# Patient Record
Sex: Female | Born: 1990 | Race: White | Hispanic: No | Marital: Single
Health system: Midwestern US, Community
[De-identification: ages and names within clinical notes are randomized; demographics above are authoritative.]

## PROBLEM LIST (undated history)

## (undated) DIAGNOSIS — E1043 Type 1 diabetes mellitus with diabetic autonomic (poly)neuropathy: Principal | ICD-10-CM

## (undated) DIAGNOSIS — E0500 Thyrotoxicosis with diffuse goiter without thyrotoxic crisis or storm: Secondary | ICD-10-CM

## (undated) DIAGNOSIS — E1065 Type 1 diabetes mellitus with hyperglycemia: Secondary | ICD-10-CM

## (undated) DIAGNOSIS — R7989 Other specified abnormal findings of blood chemistry: Secondary | ICD-10-CM

## (undated) DIAGNOSIS — E1021 Type 1 diabetes mellitus with diabetic nephropathy: Principal | ICD-10-CM

## (undated) DIAGNOSIS — Z1329 Encounter for screening for other suspected endocrine disorder: Secondary | ICD-10-CM

## (undated) DIAGNOSIS — E0590 Thyrotoxicosis, unspecified without thyrotoxic crisis or storm: Secondary | ICD-10-CM

## (undated) DIAGNOSIS — E109 Type 1 diabetes mellitus without complications: Secondary | ICD-10-CM

---

## 2011-09-14 ENCOUNTER — Inpatient Hospital Stay: Payer: Self-pay | Admitting: *Deleted

## 2011-09-14 LAB — BASIC METABOLIC PANEL WITH GFR
Anion Gap: 12
BUN: 4 mg/dL — ABNORMAL LOW
Calcium, Total: 8.4 mg/dL — ABNORMAL LOW
Chloride: 108 mmol/L — ABNORMAL HIGH
Co2: 22 mmol/L
Creatinine: 0.53 mg/dL — ABNORMAL LOW
EGFR (African American): >60
EGFR (Non-African Amer.): >60
Glucose: 158 mg/dL — ABNORMAL HIGH
Osmolality: 283
Potassium: 3.9 mmol/L
Sodium: 142 mmol/L

## 2011-09-14 LAB — COMPREHENSIVE METABOLIC PANEL
Albumin: 4 g/dL (ref 3.8–5.6)
Alkaline Phosphatase: 108 U/L (ref 82–169)
BUN: 8 mg/dL (ref 7–18)
Bilirubin,Total: 0.4 mg/dL (ref 0.2–1.0)
Calcium, Total: 8 mg/dL — ABNORMAL LOW (ref 9.0–10.7)
Chloride: 97 mmol/L — ABNORMAL LOW (ref 98–107)
Co2: 21 mmol/L (ref 21–32)
EGFR (African American): >60
EGFR (Non-African Amer.): >60
Glucose: 514 mg/dL (ref 65–99)
Osmolality: 297 (ref 275–301)
SGPT (ALT): 25 U/L
Sodium: 138 mmol/L (ref 136–145)
Total Protein: 7.2 g/dL (ref 6.4–8.6)

## 2011-09-14 LAB — BASIC METABOLIC PANEL
Calcium, Total: 7.4 mg/dL — ABNORMAL LOW (ref 9.0–10.7)
Creatinine: 0.5 mg/dL — ABNORMAL LOW (ref 0.60–1.30)
EGFR (African American): >60
EGFR (Non-African Amer.): >60
Osmolality: 289 (ref 275–301)
Potassium: 3.9 mmol/L (ref 3.5–5.1)
Sodium: 138 mmol/L (ref 136–145)

## 2011-09-14 LAB — URINALYSIS, COMPLETE
Glucose,UR: >=500 mg/dL (ref 0–75)
Leukocyte Esterase: NEGATIVE
Nitrite: NEGATIVE
Ph: 5 (ref 4.5–8.0)
Protein: NEGATIVE
RBC,UR: <1 /HPF (ref 0–5)
Specific Gravity: 1.028 (ref 1.003–1.030)
WBC UR: NONE SEEN /HPF (ref 0–5)

## 2011-09-14 LAB — MAGNESIUM: Magnesium: 2 mg/dL

## 2011-09-14 LAB — CBC
HCT: 43.1 % (ref 35.0–47.0)
MCH: 32.5 pg (ref 26.0–34.0)
MCV: 93 fL (ref 80–100)
Platelet: 241 10*3/uL (ref 150–440)
RBC: 4.64 10*6/uL (ref 3.80–5.20)

## 2011-09-14 LAB — DRUG SCREEN, URINE
Amphetamines, Ur Screen: NEGATIVE (ref ?–1000)
Benzodiazepine, Ur Scrn: NEGATIVE (ref ?–200)
Methadone, Ur Screen: NEGATIVE (ref ?–300)
Phencyclidine (PCP) Ur S: NEGATIVE (ref ?–25)
Tricyclic, Ur Screen: NEGATIVE (ref ?–1000)

## 2011-09-14 LAB — PROTIME-INR
INR: 1.1
Prothrombin Time: 14.5 secs (ref 11.5–14.7)

## 2011-09-14 LAB — HEMOGLOBIN A1C: Hemoglobin A1C: 13 % — ABNORMAL HIGH (ref 4.2–6.3)

## 2011-09-14 LAB — ETHANOL: Ethanol %: 0.296 % — ABNORMAL HIGH (ref 0.000–0.080)

## 2011-09-14 LAB — TROPONIN I: Troponin-I: <0.02 ng/mL

## 2011-09-15 LAB — LIPID PANEL
Cholesterol: 150 mg/dL (ref 0–200)
HDL Cholesterol: 34 mg/dL — ABNORMAL LOW (ref 40–60)
Ldl Cholesterol, Calc: 91 mg/dL (ref 0–100)
VLDL Cholesterol, Calc: 25 mg/dL (ref 5–40)

## 2011-09-15 LAB — CBC WITH DIFFERENTIAL/PLATELET
Basophil #: 0.1 10*3/uL (ref 0.0–0.1)
Eosinophil #: 0.1 10*3/uL (ref 0.0–0.7)
HCT: 39.8 % (ref 35.0–47.0)
HGB: 13.6 g/dL (ref 12.0–16.0)
Lymphocyte %: 47.2 %
MCH: 32.4 pg (ref 26.0–34.0)
MCV: 95 fL (ref 80–100)
Monocyte #: 0.7 10*3/uL (ref 0.0–0.7)
Neutrophil %: 42.4 %
RBC: 4.2 10*6/uL (ref 3.80–5.20)
RDW: 12.7 % (ref 11.5–14.5)

## 2011-09-15 LAB — BASIC METABOLIC PANEL
Co2: 25 mmol/L (ref 21–32)
Creatinine: 0.5 mg/dL — ABNORMAL LOW (ref 0.60–1.30)
EGFR (African American): >60
EGFR (Non-African Amer.): >60
Glucose: 251 mg/dL — ABNORMAL HIGH (ref 65–99)
Potassium: 3.5 mmol/L (ref 3.5–5.1)
Sodium: 142 mmol/L (ref 136–145)

## 2012-11-04 ENCOUNTER — Emergency Department: Payer: Self-pay | Admitting: Emergency Medicine

## 2013-03-04 IMAGING — CT CT HEAD WITHOUT CONTRAST
1 series · 16 of 30 positions shown, 20 images · non-contrast
Comparison: none

REASON FOR EXAM: unresponsive patient
COMMENTS:   May transport without cardiac monitor

PROCEDURE:     CT  - CT HEAD WITHOUT CONTRAST  - September 14, 2011  [DATE]
RESULT:     Comparison:  None
TECHNIQUE: Multiple axial images from the foramen magnum to the vertex were
obtained without IV contrast.

[Series 3: bone · axial · 0.42mm/px · z∈[-132,+13]mm · 16 of 33 slices shown, 20 images]
[im 2/33  brain]
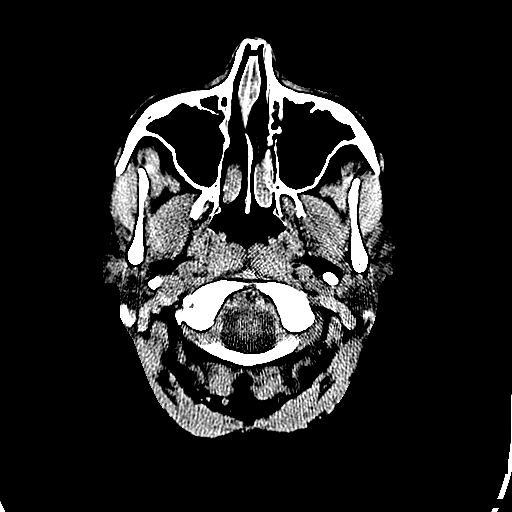
[im 2/33  bone]
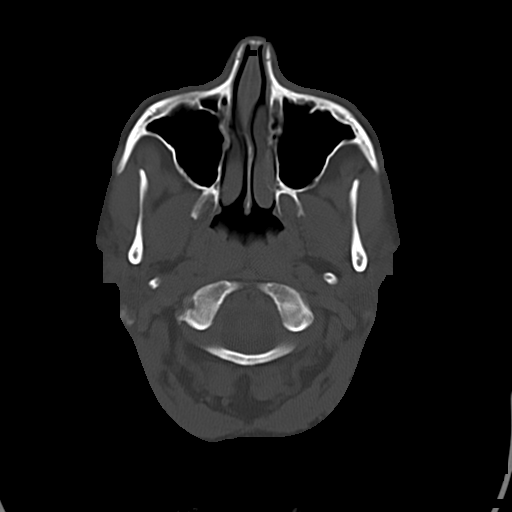
[im 4/33  brain]
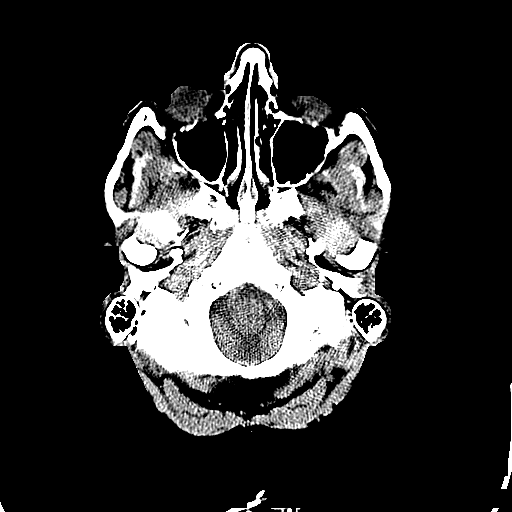
[im 6/33  brain]
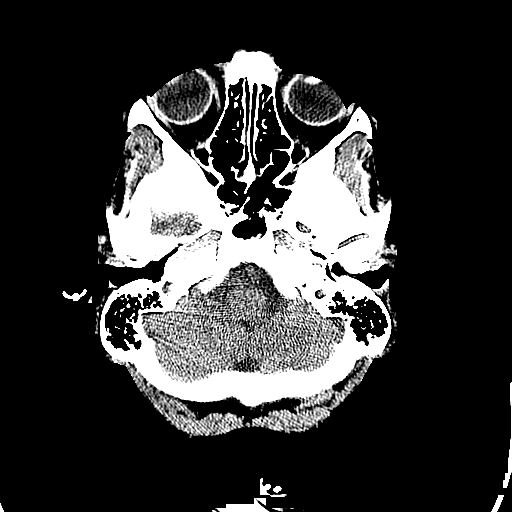
[im 8/33  brain]
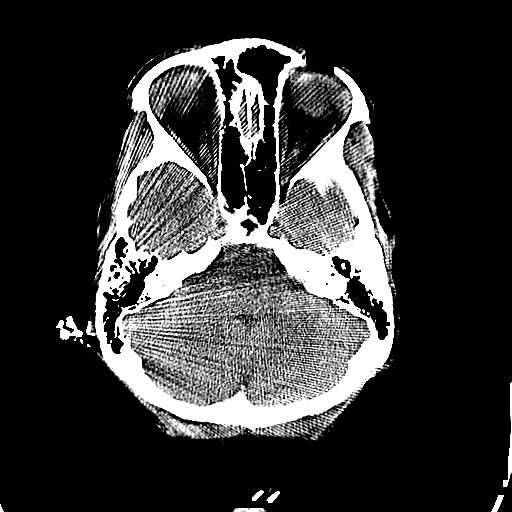
[im 9/33  brain]
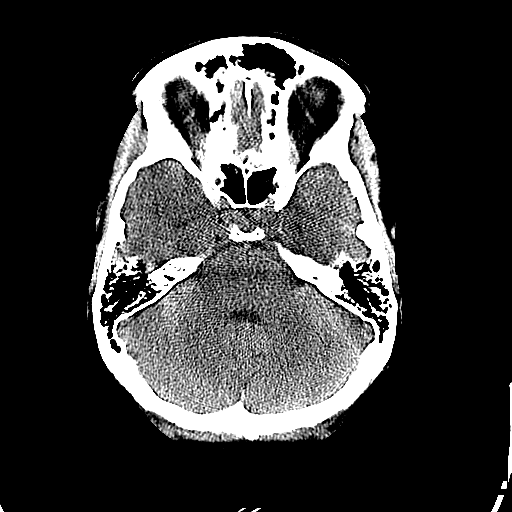
[im 9/33  bone]
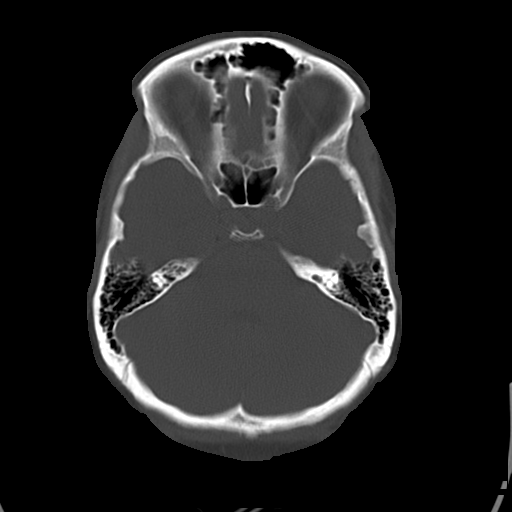
[im 12/33  brain]
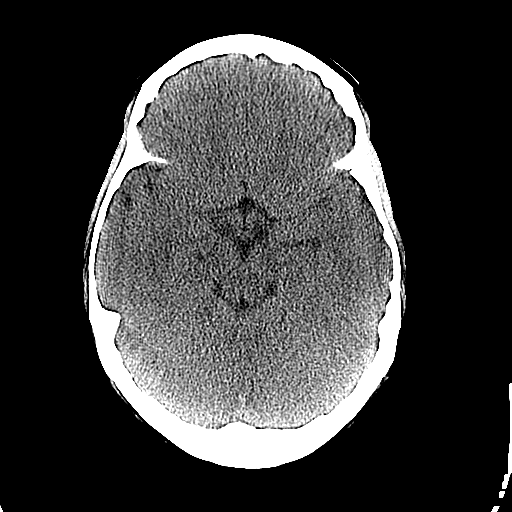
[im 14/33  brain]
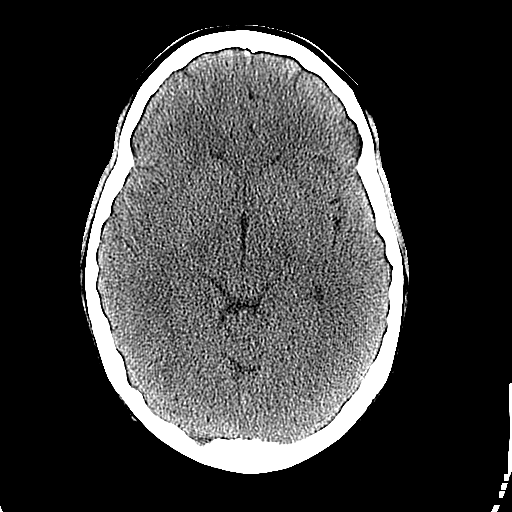
[im 16/33  brain]
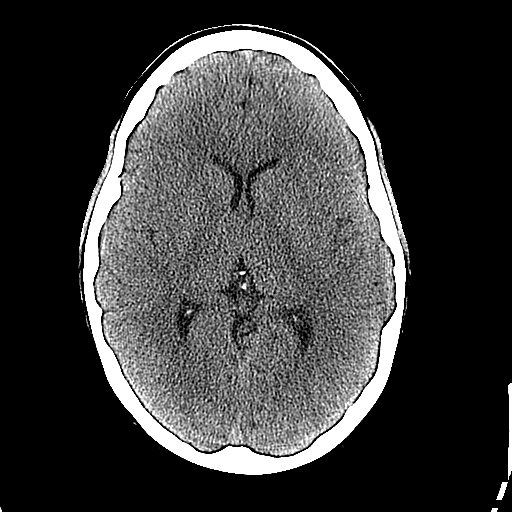
[im 17/33  brain]
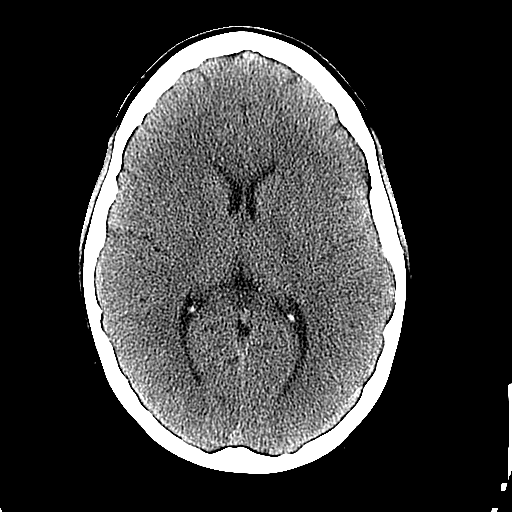
[im 17/33  bone]
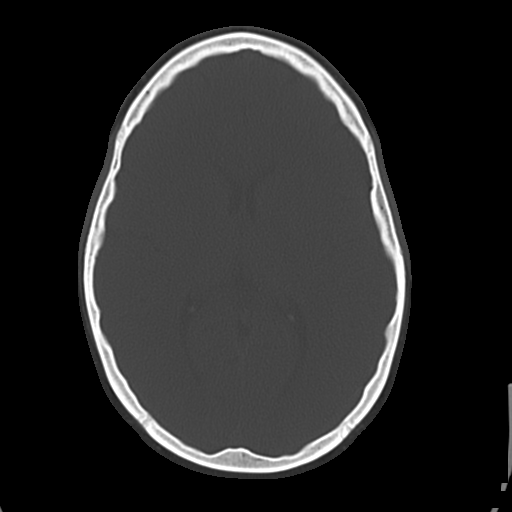
[im 19/33  brain]
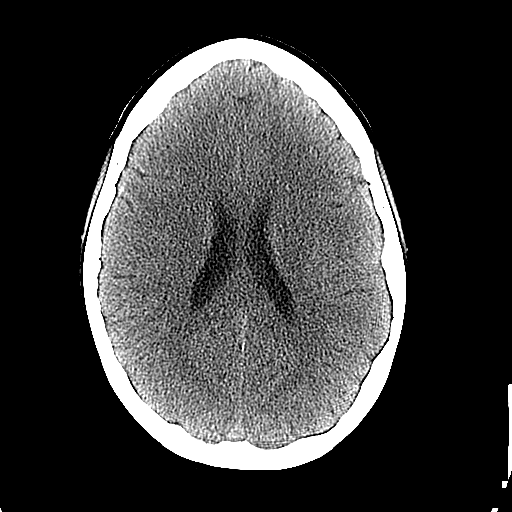
[im 21/33  brain]
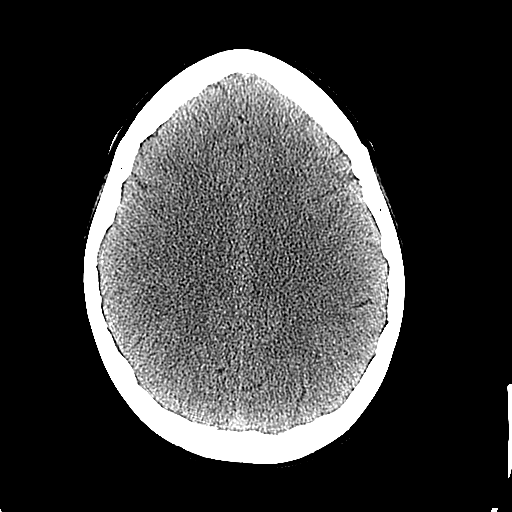
[im 24/33  brain]
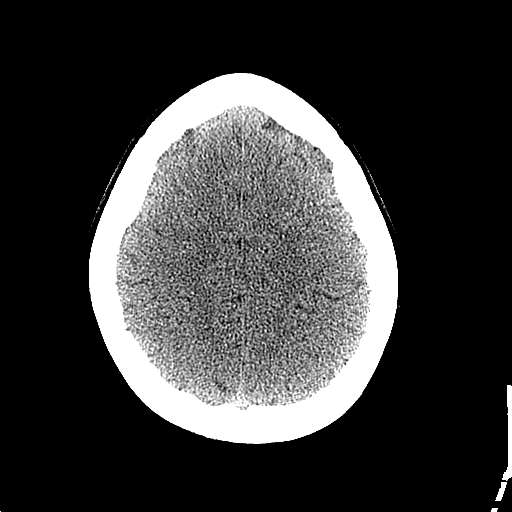
[im 25/33  brain]
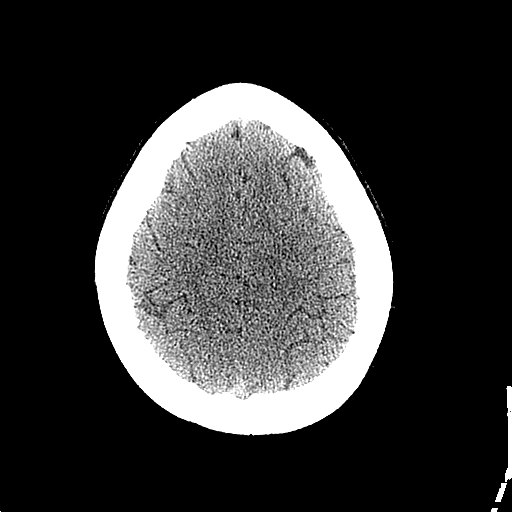
[im 25/33  bone]
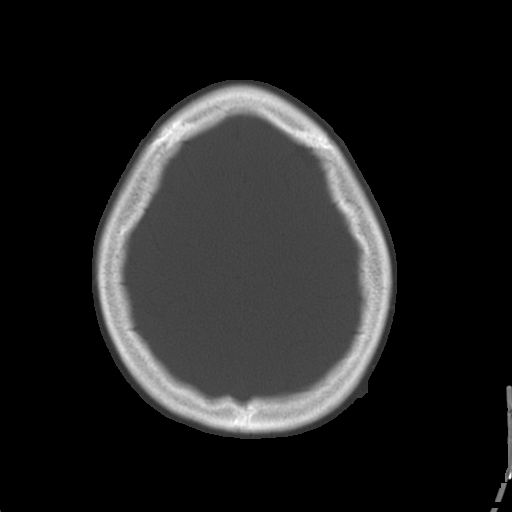
[im 27/33  brain]
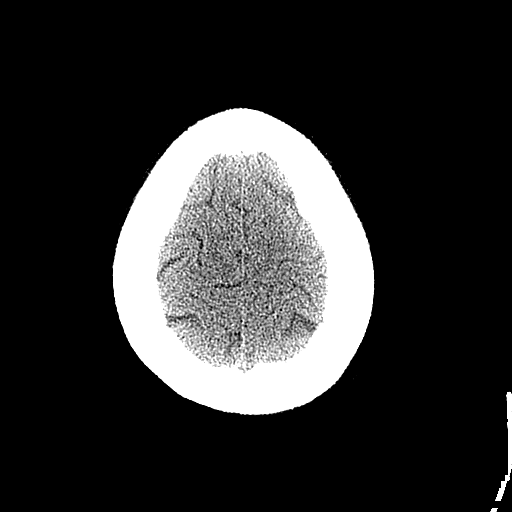
[im 29/33  brain]
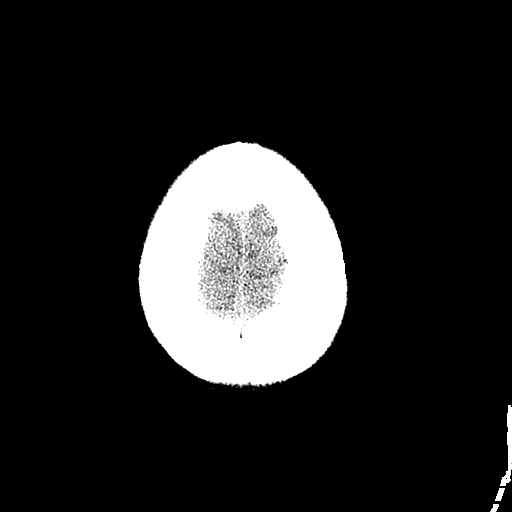
[im 31/33  brain]
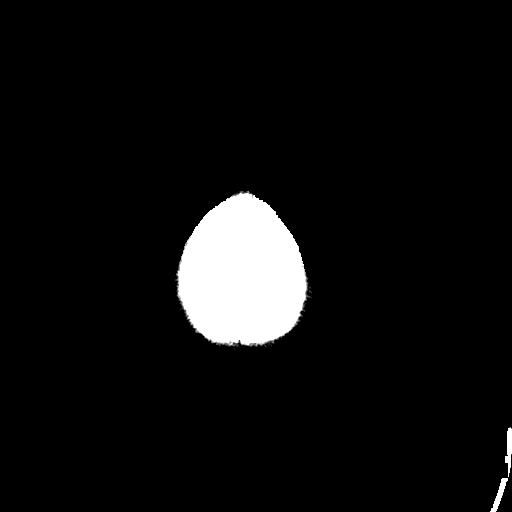

[16 of 30 positions shown; findings below may reference images not displayed]

FINDINGS: There is no evidence of mass effect, midline shift, or extra-axial fluid
collections.  There is no evidence of a space-occupying lesion or
intracranial hemorrhage. There is no evidence of a cortical-based area of
acute infarction.

The ventricles and sulci are appropriate for the patient's age. The basal
cisterns are patent.

Visualized portions of the orbits are unremarkable. The visualized portions
of the paranasal sinuses and mastoid air cells are unremarkable.

The osseous structures are unremarkable.
IMPRESSION: No acute intracranial process.

## 2013-03-04 IMAGING — CR DG CHEST 1V PORT
1 series · 1 of 1 positions shown · non-contrast
Comparison: none

REASON FOR EXAM: altered mental status
COMMENTS:

PROCEDURE:     DXR - DXR PORTABLE CHEST SINGLE VIEW  - September 14, 2011  [DATE]
RESULT:
The patient has taken a poor inspiratory effort. The lungs are clear. The
cardiovascular structures are unremarkable.

[portable]
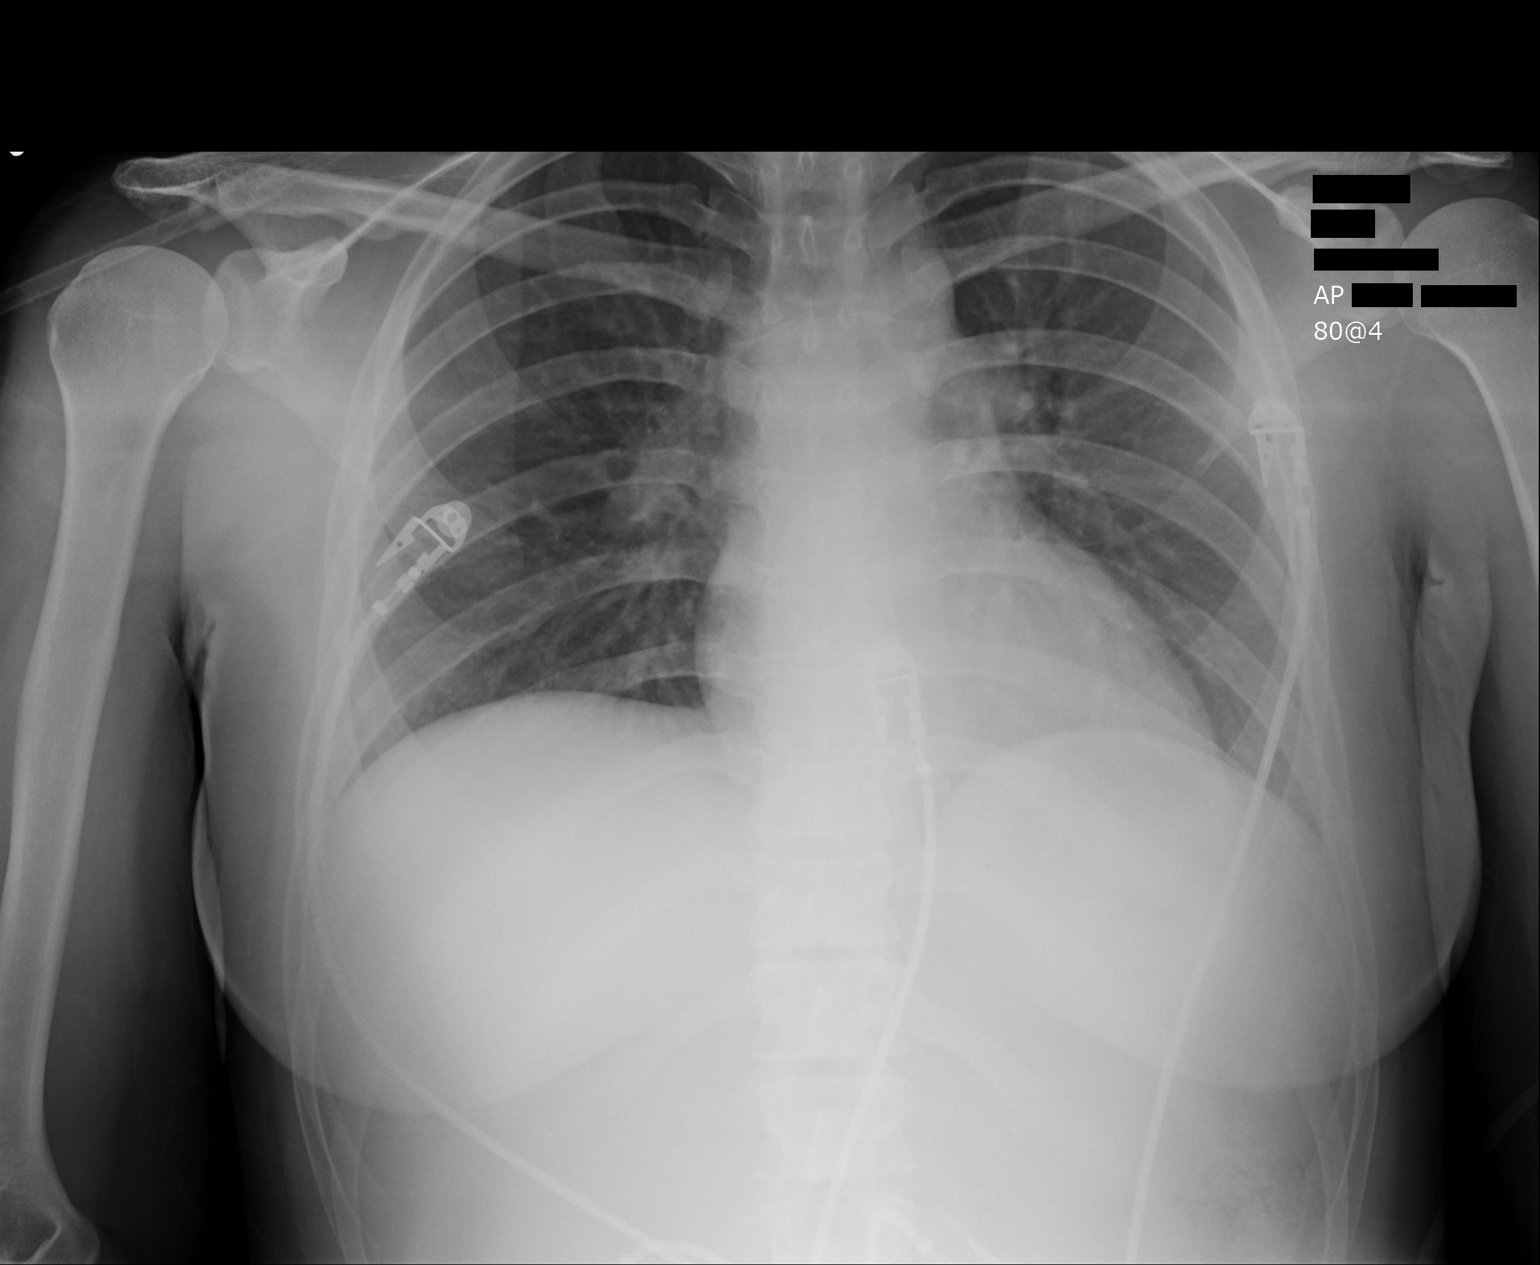

[1 of 1 positions shown; findings below may reference images not displayed]

IMPRESSION: No acute abnormality.

## 2014-04-25 IMAGING — CR DG KNEE COMPLETE 4+V*L*
1 series · 4 of 4 positions shown · non-contrast
Comparison: none

REASON FOR EXAM: pain, injury
COMMENTS:

PROCEDURE:     DXR - DXR KNEE LT COMP WITH OBLIQUES  - November 04, 2012  [DATE]
RESULT:     Comparison: None.

[Series 1: ap · 0.17mm/px · 4 of 4 slices shown]
[im 1/4]
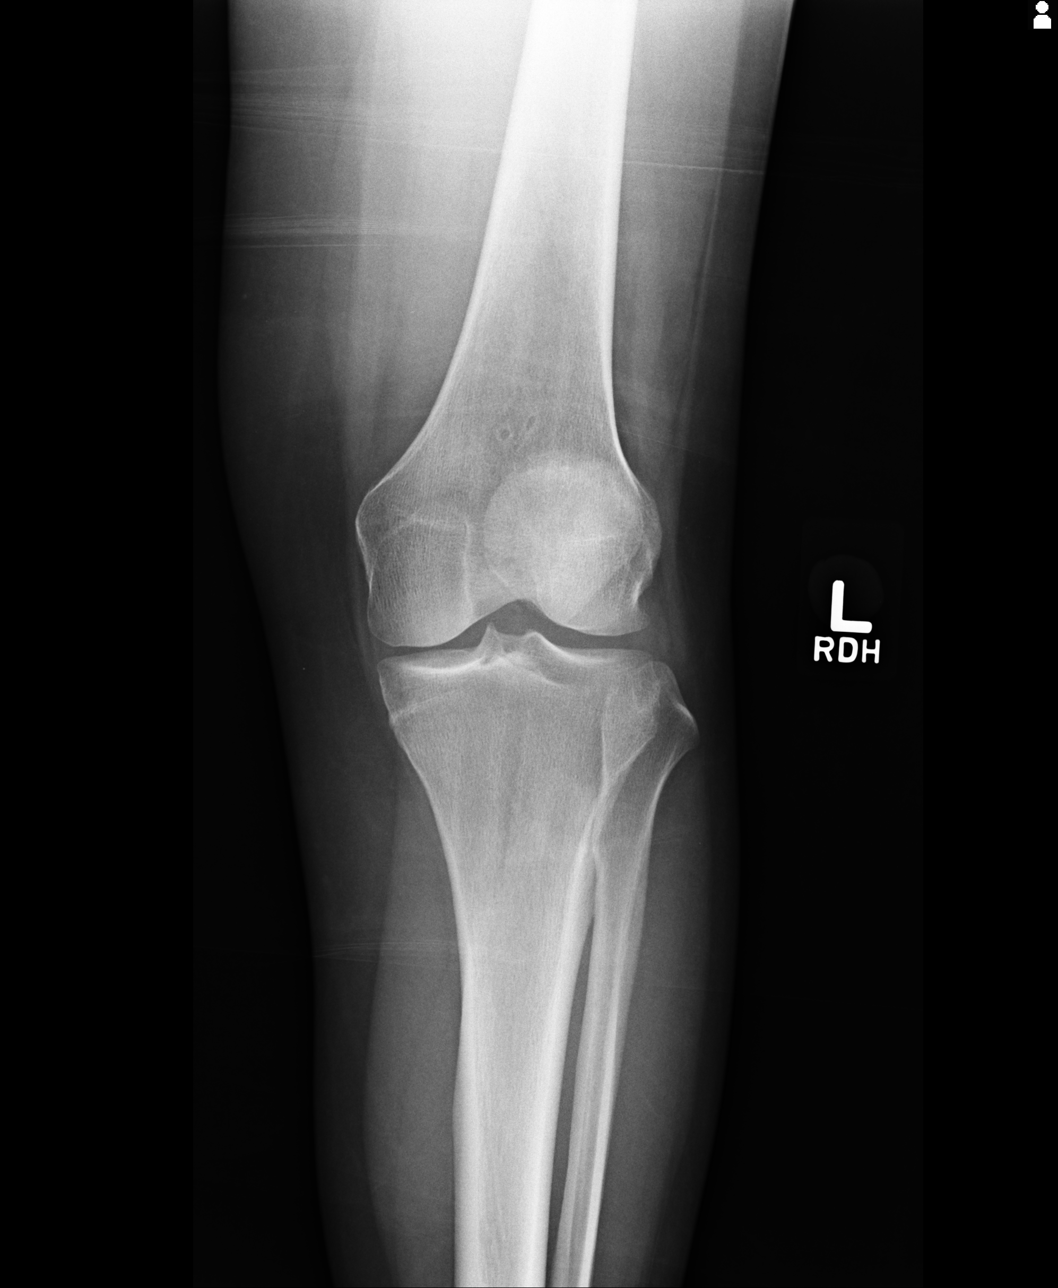
[im 2/4]
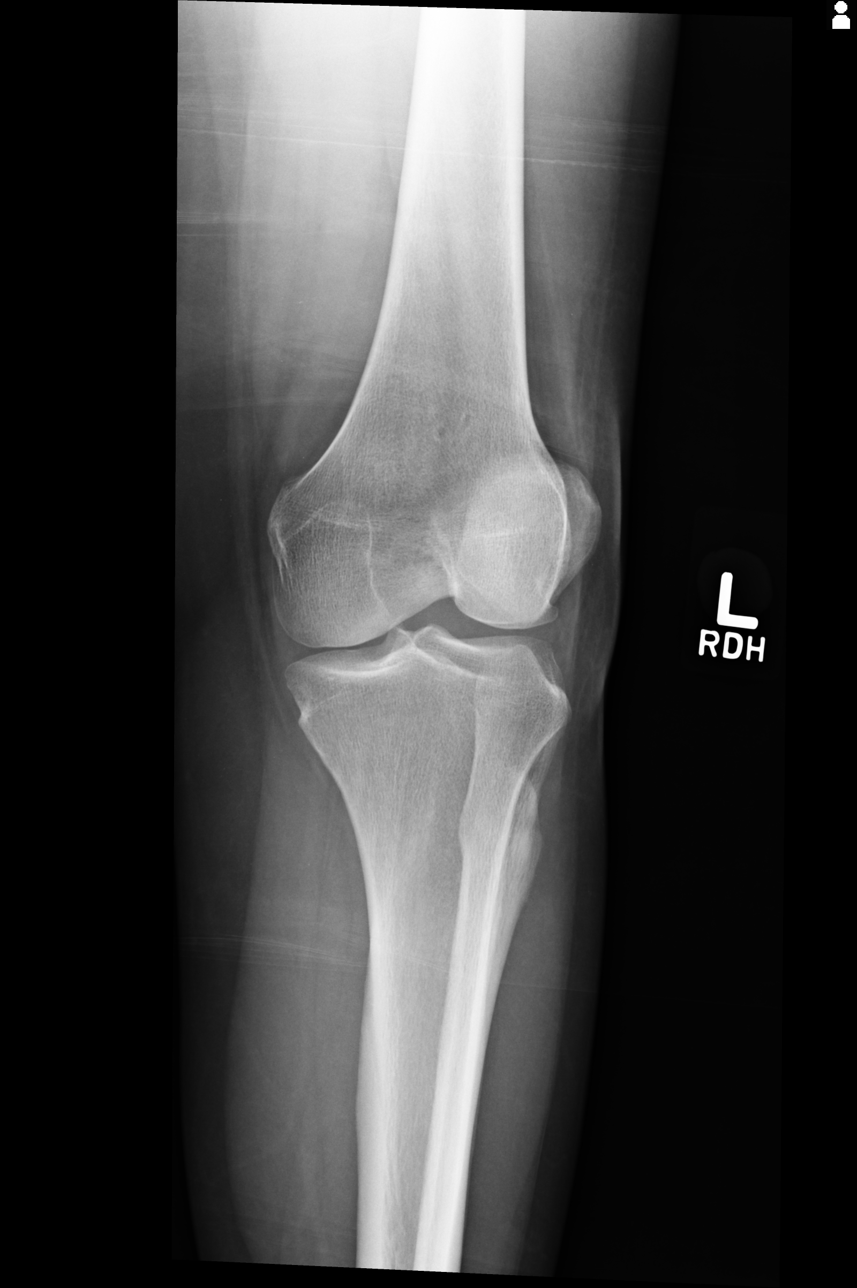
[im 3/4]
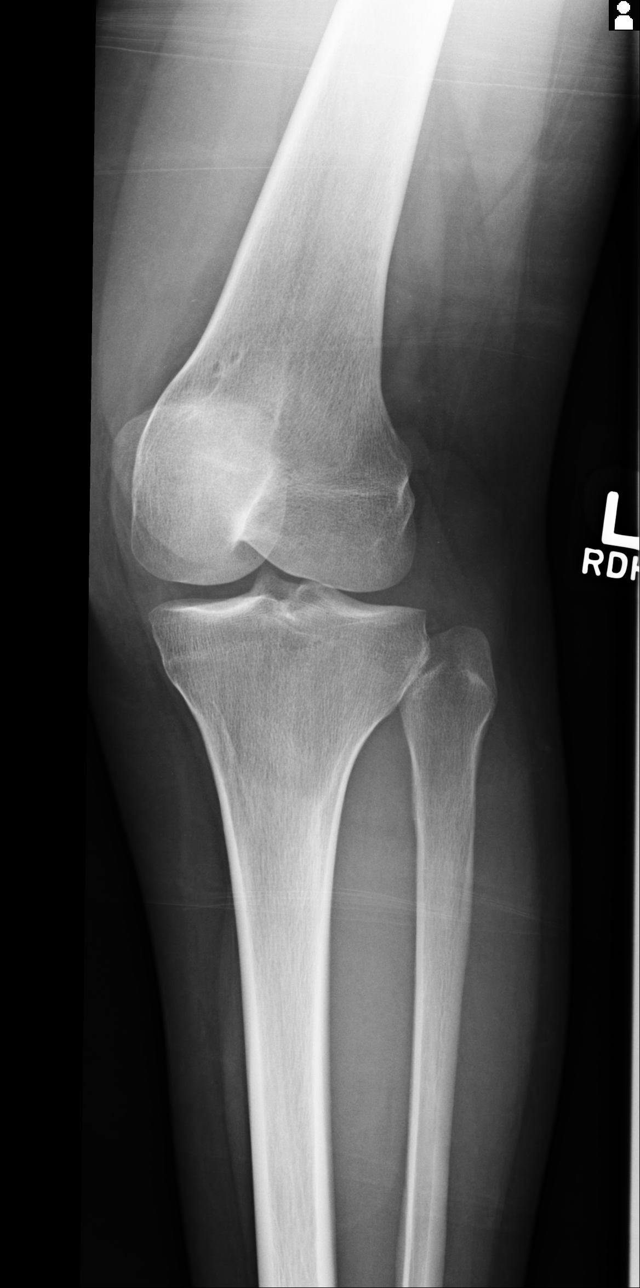
[im 4/4]
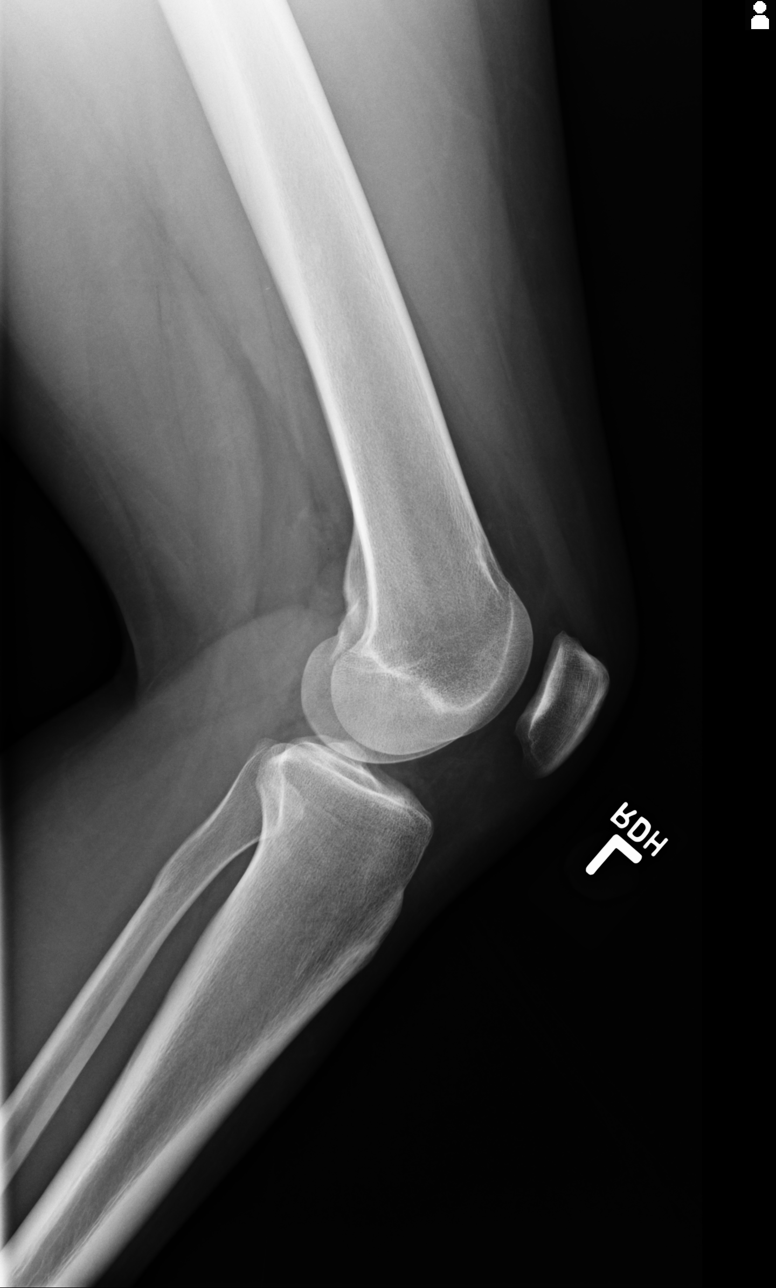

[4 of 4 positions shown; findings below may reference images not displayed]

FINDINGS: No acute fracture seen. No significant effusion. Mild irregularity of the
proximal fibula may represent sequela of old prior trauma. There are two
tiny lucencies with sclerotic margins in the distal femur which are
nonspecific, but possible tiny nonossifying fibromas.
IMPRESSION: No acute findings.

## 2014-12-14 NOTE — H&P (Signed)
PATIENT NAME:  Allison Hardy Hardy, Allison Hardy MR#:  [id number] DATE OF BIRTH:  04/16/1991  DATE OF ADMISSION:  09/14/2011  REFERRING PHYSICIAN: Dr. Margarita GrizzleWoodruff   PRIMARY PHYSICIAN: Will be establishing with Internal Medicine in [place], currently being [facility] by [facility].   PRESENTING COMPLAINT: The patient was found unresponsive face down in her own vomitus.   HISTORY OF PRESENT ILLNESS: Allison Hardy Hardy is a 24 year old woman with no prior medical history who presents via EMS after being found unresponsive face down in her vomitus on the tile of the bathroom floor. They checked her blood sugar and was reportedly critical high. She received Narcan without response on field. Her oxygen sat en route was 96% on room air. When she arrived, she was placed on oxygen and was sating 100%. The patient is still having some residual effects from the alcohol but is able to provide history and answer questions. Also discussed with her mother, Allison Hardy Hardy, with regards to history over the phone. Apparently for the past two months she has lost maybe 20 to 25 pounds. Unsure if she has actually been evaluated for the weight loss. She did see [facility] for thrush and presented to a local clinic for evaluation of yeast infection. However, there was no diagnosis of diabetes. Her mother denies any family history of diabetes. The patient currently denies any pain. She endorses binge drinking and denies any chest pain or shortness of breath.   PAST MEDICAL HISTORY: None.   PAST SURGICAL HISTORY: None.   FAMILY HISTORY: No diabetes. Maternal grandfather had hypertension and stroke.   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 No tobacco or drug use. She endorses binge drinking, this being her worst episode.  REVIEW OF SYSTEMS: CONSTITUTIONAL: No fevers or chills. The patient was having vomiting and found face down in her own vomitus. There is residual vomit in her hair and clothing. EYES: No changes in vision.  ENT: No epistaxis, discharge, or hearing loss. RESPIRATORY: No cough, wheezing, hemoptysis, or shortness of breath. CARDIOVASCULAR: No chest pain, edema, palpitations, or syncope. GI: Again, vomiting. No abdominal pain, hematemesis, or melena. GU: She denies any dysuria or hematuria. ENDOCRINE: Denies any polyuria or polydipsia. No heat or cold intolerance. HEME: No easy bleeding. SKIN: No ulcers. MUSCULOSKELETAL: No neck pain, joint pain, or swelling. NEUROLOGIC: No one-sided weakness or numbness. PSYCH: Denies any depression or endorses that this was an intentional overdose on alcohol.   PHYSICAL EXAMINATION:   VITAL SIGNS: Temperature 95.5, pulse 76, respiratory rate 20, blood pressure 132/31, sating at 100% on 2 liters.   GENERAL: Sitting in bed in no apparent distress.   HEENT: Normocephalic, atraumatic. Pupils are dilated, nonicteric. Nares with nasal cannula in place. She has residual vomitus in her mouth, dark.   NECK: Soft and supple. No adenopathy or JVP.   CARDIOVASCULAR: Slightly tachy. No murmurs, rubs, or gallops.   LUNGS: Clear to auscultation bilaterally. No use of accessory muscles or increased respiratory effort.   ABDOMEN: Soft. Positive bowel sounds. No mass appreciated.   EXTREMITIES: No edema. Dorsal pedis pulses intact.   MUSCULOSKELETAL: No joint effusion.   SKIN: No ulcers.   NEUROLOGIC: No dysarthria or aphasia. Symmetrical strength. No focal deficits.   PSYCH: She is alert. She is answering questions appropriately but when asked the time or date she has difficulty with producing the month and year.   PERTINENT LABS AND STUDIES: Venous gas with pH of 7.22. WBC 8.5, hemoglobin 15.1, hematocrit 43.1, platelets 241, MCV  93, glucose 514, BUN 8, creatinine 0.69, sodium 138, potassium 3.0, chloride 97, carbon dioxide 21, calcium 8. LFTs within normal limits. Troponin less than 0.02. INR 1.1. Alcohol 296. Urinalysis with specific gravity of 1.028, pH 5. Urine drug  screen negative. Pregnancy test negative. Magnesium level 2. CT scan of the head without any acute findings. Chest x-ray without infiltrate. EKG with rate of 99. No ST elevation.   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  1. New onset diabetes with anion gap acidosis/DKA. CO2, however, is normal likely with some contraction alkalosis. Start on insulin drip. Diabetes referral. Endocrine consultation. Dietitian consultation. Since she is presenting with acidosis of older age of onset, this could likely be type I. Will get GAD 65, insulin level, and insulin autoantibodies. Follow BMP. Send TSH, fasting lipid panel, A1c. Chest x-ray without evidence of infiltrate, low suspicion for aspiration event at this point.  2. Hypokalemia, could worsen with additional insulin. Normal saline with K to continue and mag is within normal limits.  3. Alcohol poisoning/intoxication. Urine drug screen is negative. The patient and mother deny any prior history of alcohol abuse. Obtain substance abuse counselor.  4. Weight loss likely in the setting of diabetes. TSH as above. Given reports of oral thrush and yeast infection, which is more than likely due to her diabetes, uncontrolled. Will also send HIV.  5. Prophylaxis with Lovenox and Protonix.   Mom is Allison Hardy Hardy. Her contact number is cell [phone].   TIME SPENT: Approximately 45 minutes spent on patient care.    ____________________________ Reuel Derby, MD ap:drc D: 09/14/2011 04:04:05 ET T: 09/14/2011 06:48:38 ET JOB#: [id number]  cc: Pearlean Brownie Mell Guia, MD, <Dictator> Reuel Derby MD ELECTRONICALLY SIGNED 10/18/2011 0:30

## 2014-12-14 NOTE — Consult Note (Signed)
PATIENT NAME:  Allison Hardy, Allison Hardy MR#:  [id number] DATE OF BIRTH:  02-Oct-1990  DATE OF CONSULTATION:  09/14/2011  REFERRING PHYSICIAN:  Dr. Pearlean Brownie Phichith  CONSULTING PHYSICIAN:  A. Wendall Mola, MD  CHIEF COMPLAINT: New onset diabetes.   HISTORY OF PRESENT ILLNESS: This is a 24 year old female who was admitted early this morning from the ED. She had been found down unresponsive in the bathroom of her dormitory at [facility] after a night of heavy drinking. She had an initial critically high blood sugar and in the ED a repeat blood sugar was 514, bicarbonate 21, pH 7.22, anion gap 20, alcohol level 0.29, trace urinary ketones, and hemoglobin A1c of 13%. This is suggestive of mild diabetic ketoacidosis and alcohol intoxication. She was treated with IV fluids and IV insulin and has been monitored in the Critical Care Unit blood. Blood sugars have improved and she is feeling much better. She had been feeling well in recent weeks. She denies any recent polyuria or polydipsia. She has had a 10-25 pound weight loss since August of last year. She denies any recent blurred vision. She does report fatigue. She developed thrush after treatment with antibiotics in October and otherwise had been well.   PAST MEDICAL HISTORY: None.   PAST SURGICAL HISTORY: None.   FAMILY HISTORY: No known diabetes.   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 Drinks alcohol about one time weekly. Denies tobacco use. Denies illicit drug use. Native of [place], [place].    REVIEW OF SYSTEMS: GENERAL: Weight loss as per history of present illness. No recent fevers or chills. HEENT: No blurred vision. No sore throat. NECK: No neck pain. No dysphasia. CARDIAC: No chest pain. No palpitations. PULMONARY: No cough, no shortness of breath. ABDOMEN: No abdominal pain. Nausea has resolved. Normal bowel habits. EXTREMITIES: Denies leg swelling. MUSCULOSKELETAL: No weakness. NEUROLOGIC: No dizziness or recent  falls. ENDO: No heat or cold intolerance. HEME: No easy bruisability or recent bleeding.   PHYSICAL EXAMINATION:  VITAL SIGNS: Temperature 98.4, pulse 96, respirations 21, blood pressure 128/71, pulse oximetry 96% on room air.   GENERAL: Well-developed, well-appearing white female in no distress.  PSYCHIATRIC: Alert and oriented x3, calm, and cooperative.   SKIN: No rash or dermatopathy. No acanthosis nigricans. No alopecia.   HEENT: Extraocular movements are intact. Oropharynx is clear. Mucous membranes moist.   NECK: Supple. No thyromegaly.   CARDIAC: Regular rate and rhythm. No carotid bruit. 2+ DP pulses.   PULMONARY: Clear to auscultation bilaterally. No wheeze. No rhonchi. Normal inspiratory effort.   ABDOMEN: Diffusely soft, nontender, nondistended. Positive bowel sounds.   EXTREMITIES: No edema is present.   NEUROLOGIC: No gross deficits.   LABORATORY, DIAGNOSTIC AND RADIOLOGICAL DATA: Laboratory at 5:30 a.m. today: Glucose 395, BUN 5, creatinine 0.5, sodium 138, potassium 3.9, chloride 104, CO2 18, calcium 7.4. Urine drug screen was negative. Urine hCG was negative. Hematocrit 43.1%. WBC 8.5, platelets 241. Urinalysis positive for greater than 500 glucose and trace ketones, otherwise unremarkable.   Head CT 01/23 showed no acute intracranial process.   Portable chest x-ray on 01/23 showed no acute abnormality.   ASSESSMENT: 24 year old female admitted overnight for alcohol intoxication and severe hyperglycemia with mild diabetic ketoacidosis.  1. New onset diabetes. Diabetes DKA has resolved. She will be transitioned to subcutaneous insulin. We will determine appropriate dose by weight. Her weight is 65 kg, so will start with a dose of 0.4 units/kilo per day and give Lantus 15 units once daily and NovoLog  4 units before meals in addition to a sliding scale of 1 unit per 50 blood sugars over a target of 150. She will receive diabetic teaching. She has already received teaching  from the nutritionist. She will be given a glucometer and instructed on self-monitoring of blood sugars and she will be instructed on injections of insulin with insulin pens. I suspect she has type 1 diabetes and a GAD antibody has been ordered. I explained this to her and explained that this diagnosis means a lifelong need for multiple daily injections of insulin.  2. Disposition. Expect if sugars remain stable, she could go home tomorrow. She plans to travel to [place] on Friday and has a doctor's appointment already set up for herself there next Tuesday. She does not wish to plan for follow up in my clinic at this time. She would benefit from diabetes education classes and [facility] once she returns from [place].   Thank you for the kind request for consultation. I will follow along with you.   ____________________________ A. Wendall MolaMelissa Solum, MD ams:cms D: 09/14/2011 16:38:06 ET T: 09/15/2011 08:47:07 ET JOB#: [id number]  cc: A. Wendall MolaMelissa Solum, MD, <Dictator>  Macy MisA. MELISSA SOLUM MD ELECTRONICALLY SIGNED 09/15/2011 13:54

## 2014-12-14 NOTE — Discharge Summary (Signed)
PATIENT NAME:  Allison FlavorsCARTER, Racheal MR#:  [id number] DATE OF BIRTH:  1990/09/30  DATE OF ADMISSION:  09/14/2011 DATE OF DISCHARGE:  09/15/2011  DISCHARGE DIAGNOSES:  1. 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 Hemoglobin A1c of 13. 2. Hypokalemia, resolved. 3. Unresponsive secondary to acute alcohol intoxication, now resolved.  CONSULTATION: Endocrinology, Dr. 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 She was found unresponsive face down in her vomitus. EMS was called in. They checked her blood sugars and it was critically high. She received Narcan in the field without any response. She was complaining of some polyuria and polydipsia for the last month and also has lost about 20 to 25 pounds. She also had developed thrush recently. When she came in to the Emergency Room, she was found to have a sugar of 514, bicarb was 21, creatinine was 0.69, and she was hypokalemic with a potassium of 3 but her gap was 20. She was started on insulin drip. She had mild diabetic ketoacidosis. Because of her unresponsive episode, she had a CT of the head done that was essentially negative for any acute intracranial process. Her alcohol level was found to be 296 mg/dL. Her urine drug screen was negative. Her hemoglobin A1c was found to be 13 so she has new onset diabetes. Her urine pregnancy test was negative. She had glutamic acid decarboxylase autoantibody sent because of her type I diabetes which are pending and can be followed up at Dr. Pricilla HandlerSolum's office. HIV antibodies were negative. Her urinalysis showed trace ketones, glucose greater than 500 but nitrite and leukocyte esterase negative. Her chest x-ray was negative. She was transitioned off the insulin drip and she was started on Lantus at bedtime and NovoLog insulin standing dose of 4 units t.i.d. along with sliding scale. We are going to discharge her on the  present regimen of Lantus at bedtime and NovoLog standing with sliding scale. Her creatinine today is 0.5, her gap has closed to 12, potassium level 3.5, and her bicarb is 25. Her sugars are running in the range of 250's. Her lipid profile shows that her triglycerides are 124 and LDL 91. TSH is 0.594. When she came in, her LFTs were normal. Dietary consult was called and she has been given education about diabetic diet. Lifestyle consult was called in and she has been given education about glucometer and checking glucose and the disease process. Also when she came in, her venous gas showed a pH of 7.22, pCO2 48. Her EKG showed sinus rhythm, no acute ischemic changes. I am giving her prescriptions for Lantus insulin and NovoLog insulin. She has been explained about sliding scale by the nurse. She already has been given a glucometer. I am giving her a prescription for glucose strips, alcohol swabs, lancets, and syringes. I also called her mother in [place] and explained to her that she is a diabetic, she needs insulin, and she is being discharged on Lantus and short-acting insulin. She has been set [facility] with [facility] in [place]. I also advised her that she needs to follow-up with someone here in [place]  when she comes back so she has been set up with Dr. Tedd SiasSolum at [facility]. Will also try to fax her records to [facility] as release form has been signed by the patient and this was requested by the mother to send information to [facility].   MEDICATIONS AT DISCHARGE: 1.  Lantus insulin 15 units sub-Q once daily at bedtime.  2. NovoLog insulin 4 units sub-Q three times a day before meals.  3. Fingerstick blood sugar before meals. 4. NovoLog sliding scale. 0 units if sugar is 0 to 150; 1 units if sugar 151 to 200; 3 units if sugar 201 to 250; 5 units if sugar 251 to 300; 7 units if sugar 301 to 350; 9 units if sugar 351 to 400; call MD if sugar greater than 400.   DIET:  Advised an 1800 calorie ADA diet.   CONDITION AT DISCHARGE: She is comfortable. No complaints. T-max 97.9, heart rate 62, blood pressure 107/74, saturating 100% on room air. Chest is clear. Heart sounds are regular. Abdomen soft, nontender. She is well hydrated.  FOLLOW-UP: Follow-up with Dr. Tedd Sias, [facility], after she returns from [place]. The patient has been set up with PMD at [facility].   TIME SPENT WITH DISCHARGE: 60 minutes.   ____________________________ Fredia Sorrow, MD ag:drc D: 09/15/2011 11:43:24 ET T: 09/15/2011 13:06:53 ET JOB#: [id number]  cc: Fredia Sorrow, MD, <Dictator> A. Wendall Mola, MD Fredia Sorrow MD ELECTRONICALLY SIGNED 10/17/2011 11:27

## 2014-12-14 NOTE — Consult Note (Signed)
Chief Complaint and History:   Referring Physician Dr. Margie EgePhichith    Chief Complaint New onset diabetes   Allergies:  No Known Allergies:   Assessment/Plan:   Assessment/Plan Patient was seen and examined. Briefly, this is a 24 year old F who was found [facility] at [facility] unconscious with critically high blood sugar, acidotic, has been treated with IVF and IV insuli with good improvement. Hgb A1c is >13% c/w new onset diabetes, likely type 1. No family history of diabetes.  A / New onset diabetes P/ -Transition to SQ basal-bolus insulin with Lantus 15 units qHS and NovoLog 4 units qAC plus NovoLog modified SSI. -Diabetes teaching needed -Expect ok to DC tomorrow. She will be going to her family in [place] on 1/25 and plans to see a physician there. She was not interested in establishing out-pt follow-up at this time.  Full consult has been dictated.    Case Discussed With patient   Electronic Signatures: Raj JanusSolum, Ellsworth Waldschmidt M (MD)  (Signed 23-Jan-13 13:57)  Authored: Chief Complaint and History, ALLERGIES, Assessment/Plan   Last Updated: 23-Jan-13 13:57 by Raj JanusSolum, Sanaz Scarlett M (MD)

## 2014-12-20 ENCOUNTER — Other Ambulatory Visit: Payer: Self-pay

## 2014-12-20 ENCOUNTER — Emergency Department
Admit: 2014-12-20 | Discharge: 2014-12-21 | Disposition: A | Discharge status: Home / Self Care | Payer: PRIVATE HEALTH INSURANCE | Source: Ambulatory Visit | Attending: Emergency Medicine | Admitting: Emergency Medicine

## 2014-12-20 DIAGNOSIS — Z79899 Other long term (current) drug therapy: Secondary | ICD-10-CM | POA: Diagnosis not present

## 2014-12-20 DIAGNOSIS — Z794 Long term (current) use of insulin: Secondary | ICD-10-CM | POA: Diagnosis not present

## 2014-12-20 DIAGNOSIS — E860 Dehydration: Secondary | ICD-10-CM | POA: Insufficient documentation

## 2014-12-20 DIAGNOSIS — E109 Type 1 diabetes mellitus without complications: Secondary | ICD-10-CM | POA: Insufficient documentation

## 2014-12-20 DIAGNOSIS — R55 Syncope and collapse: Secondary | ICD-10-CM | POA: Insufficient documentation

## 2014-12-20 HISTORY — DX: Type 1 diabetes mellitus without complications: E10.9

## 2014-12-20 LAB — COMPREHENSIVE METABOLIC PANEL
ALBUMIN: 3.8 g/dL
ALK PHOS: 92 U/L
ALT: 18 U/L
AST: 17 U/L
Anion Gap: 8 (ref 7–16)
BUN: 11 mg/dL
Bilirubin,Total: 0.8 mg/dL
CALCIUM: 9 mg/dL
CO2: 26 mmol/L
Chloride: 99 mmol/L — ABNORMAL LOW
Creatinine: 0.66 mg/dL
EGFR (African American): >60
EGFR (Non-African Amer.): >60
Glucose: 472 mg/dL — ABNORMAL HIGH
Potassium: 3.7 mmol/L
Sodium: 133 mmol/L — ABNORMAL LOW
Total Protein: 6.8 g/dL

## 2014-12-20 LAB — CBC WITH DIFFERENTIAL/PLATELET
BASOS ABS: 0.1 10*3/uL (ref 0.0–0.1)
Basophil %: 0.7 %
EOS ABS: 0.1 10*3/uL (ref 0.0–0.7)
Eosinophil %: 1.6 %
HCT: 41 % (ref 35.0–47.0)
HGB: 14.2 g/dL (ref 12.0–16.0)
LYMPHS ABS: 2.4 10*3/uL (ref 1.0–3.6)
Lymphocyte %: 25.3 %
MCH: 30.7 pg (ref 26.0–34.0)
MCHC: 34.5 g/dL (ref 32.0–36.0)
MCV: 89 fL (ref 80–100)
MONO ABS: 1.2 x10 3/mm — AB (ref 0.2–0.9)
Monocyte %: 12.3 %
NEUTROS ABS: 5.6 10*3/uL (ref 1.4–6.5)
Neutrophil %: 60.1 %
PLATELETS: 251 10*3/uL (ref 150–440)
RBC: 4.62 10*6/uL (ref 3.80–5.20)
RDW: 12.3 % (ref 11.5–14.5)
WBC: 9.4 10*3/uL (ref 3.6–11.0)

## 2014-12-20 LAB — LIPASE, BLOOD: Lipase: 22 U/L

## 2014-12-20 LAB — TROPONIN I

## 2014-12-21 ENCOUNTER — Encounter: Payer: Self-pay | Admitting: Emergency Medicine

## 2014-12-21 LAB — URINALYSIS COMPLETE WITH MICROSCOPIC (ARMC ONLY)
Bilirubin Urine: NEGATIVE
Glucose, UA: 500 mg/dL — AB
Hgb urine dipstick: NEGATIVE
LEUKOCYTES UA: NEGATIVE
NITRITE: NEGATIVE
PH: 6 (ref 5.0–8.0)
Protein, ur: NEGATIVE mg/dL
Specific Gravity, Urine: 1.033 — ABNORMAL HIGH (ref 1.005–1.030)

## 2014-12-21 LAB — GLUCOSE, CAPILLARY
GLUCOSE-CAPILLARY: 276 mg/dL — AB (ref 70–99)
Glucose-Capillary: 302 mg/dL — ABNORMAL HIGH (ref 70–99)

## 2014-12-21 NOTE — ED Notes (Signed)
Pt's CBG checked and resulted 276.  Pt states she feels comfortable managing blood glucose at home at this point.

## 2014-12-21 NOTE — ED Notes (Signed)
Pt states she does not need pain medication at this time. 

## 2014-12-21 NOTE — ED Notes (Signed)
Pt's triage, assessment, and other nurses' notes charted in [facility] or paper chart prior to [facility] launch.

## 2016-03-29 ENCOUNTER — Ambulatory Visit

## 2016-03-29 LAB — HX .AUTOMATED DIFF
CASE NUMBER: 2017220001733
HX ABSOLUTE NEUTRO COUNT: 6710 /mm3
HX BASOPHILS: 1 % — NL (ref 0.0–1.0)
HX EOSINOPHILS: 0 % — NL (ref 0.0–3.0)
HX IMMATURE GRANULOCYTES: 1 % — NL (ref 0.0–2.0)
HX LYMPHOCYTES: 21 % — ABNORMAL LOW (ref 22.0–40.0)
HX MONOCYTES: 12 % — ABNORMAL HIGH (ref 0.0–11.0)
HX NEU CT MEASURED: 6.71
HX NEUTROPHILS: 66 % — NL (ref 40.0–71.0)

## 2016-03-29 LAB — HX CBC W/ DIFF
CASE NUMBER: 2017220001733
HX HCT: 42.3 % — NL (ref 36.0–47.0)
HX HGB: 14.5 g/dL — NL (ref 11.8–15.8)
HX MCH: 31 pg — NL (ref 27.0–31.0)
HX MCHC: 34.3 g/dL — NL (ref 32.0–36.0)
HX MCV: 90 fL — NL (ref 81.0–99.0)
HX MPV: 10.9 fL — NL (ref 7.4–11.5)
HX NRBC PERCENT: 0 % — NL
HX PLATELET: 360 10*3/uL — NL (ref 150.0–400.0)
HX RBC: 4.72 10*6/uL — NL (ref 3.6–5.0)
HX RDW: 12.3 % — NL (ref 11.5–14.5)
HX WBC: 10.2 10*3/uL — NL (ref 3.7–11.2)

## 2016-03-29 NOTE — Progress Notes (Signed)
* * *        **Angela [name]    --- ---    53 Y old Female, DOB: 1991/01/23    Account Number: [id number]    [address] [place], [place]    Home: [phone]    Guarantor: Angela [name] Insurance: [employer] Payer ID: PAPER    PCP: Howell Pringle    Appointment Facility: [facility]        * * *    03/29/2016  Progress Notes: [name] Tomio Kirk, APGNP.    --- ---    ---        Current Medications    ---    Taking     * NovoLog 100 UNIT/ML Solution Subcutaneous     ---    * Lantus 100 UNIT/ML Solution 25 units Subcutaneous at bedtime    ---    * Duloxetine HCl N/A Capsule Delayed Release Particles , Notes: Patient is not sure of dosage    ---    * Medication List reviewed and reconciled with the patient    ---      Active Problem List    ---      E10.9      Type 1 diabetes        --- ---    M79.7      Fibromyalgia        --- ---    R53.83      Fatigue        --- ---    Z00.00      Routine adult health maintenance        --- ---      Surgical History    ---      Denies Past Surgical History    ---      Family History    ---      Father: alive, diagnosed with Hypertension    ---    Mother: alive, diagnosed with Hypertension    ---      Social History    ---     _Quality 2017:_    no Smoking Are you a: never smoker.    _Drug/Alcohol:_    no Drugs . Alcohol: yes Points 1, Interpretation Negative.    _Miscellaneous:_    Marital status: Single. Occupation: Social worker. no Children. Living with: parents.      Gyn History    ---    Periods : none due to Implanon.    Sexual activity not currently sexually active.    Last pap smear date approximately two years ago .    Abnormal pap smear denies.    Birth control Implanon.    GYN [facility].      OB History    ---    Total living children 0\.      Allergies    ---      N.K.D.A.    ---        Reason for Appointment    ---      1\. New patient    ---      History of Present Illness    ---     _._ :    Angela [name] is a new  patient to the practice. She has not had a PCP in years.    She is followed closely by Endocrinologist, Dr. Isaias Sakai, at [facility].    Patient reports her blood sugars have been in the 150  range recently.    She reports not feeling well since going on a difficult hike last week. She  had trouble coming down the mountain due to discomfort in her legs. She  vomited once. Patient also reports feeling tired and sweating more than usual.    Patient is planning on moving to [place] in the next two months.    ROS otherwise negative including for SOB, joint pain, fever, diarrhea,  constipation, or diarrhea.      Vital Signs    ---    Wt 142, Ht 68, BMI 21.59, HR 88, BP 100/70.      Physical Examination    ---     _GENERAL_ :    Appears stated as: yes. Build: normal . Eye contact: normal. General  Appearance: no acute distress. Hygiene: good. Ill-appearance: minimal. Mental  Status: alert and oriented. Mood/Affect: pleasant. Race: caucasion. Speech:  clear.    _HEENT_ :    Ears: no gross hearing deficits, external ear unremarkable, ear canals with  slight erythema, tympanic membranes normal bilaterally. Eyes: PERRLA, EOMI,  non-icteric sclera, conjunctiva clear. Head: atraumatic, normocephalic. Nose:  normal pink mucosa. Pharynx: mild erythema.    _NECK_ :    Cervical lymph nodes: anterior cervical lymphadenopathy, nontender, mobile,  soft, bilateral. Jugular venous distension: none. Neck supple. Thyroid: no  thyromegaly.    _LUNGS_ :    Auscultation: CTA bilaterally. Effort: no respiratory distress.    _HEART_ :    Edema: trace LLE edema. Heart sounds: normal. Murmurs: none. Rate: normal.  Rhythm: regular.    _ABDOMEN_ :    Bowel sounds: normoactive. Distention: none. General: normal. Tenderness:  none.    _EXTREMITIES_ :    Arthritic Deformity: none. Clubbing: none. Cyanosis: none. Pulses: 1+, left,  dorsalis pedis, otherwise 2+.    _NEUROLOGICAL_ :    Cranial Nerves: CN's II-XII grossly intact. Gait: normal.     _GENITOURINARY - FEMALE_ :    Exam deferred per patient's preference, to gynecologist.    _SKIN_ :    Color: flushed. General: warm, diaphoretic at times. Skin Lesion(s): LLE with  scabs from insect bites.    _BACK_ :    General: normal spinal curvature. Lower back pain: no .    _BREASTS_ :    Exam deferred per patient's perference, to gynecologist.          Assessments    ---    1\. Type 1 diabetes - E10.9 (Primary)    ---    2\. Fibromyalgia - M79.7    ---    3\. Fatigue - R53.83    ---    4\. Routine adult health maintenance - Z00.00    ---      Treatment    ---       **1\. Type 1 diabetes**    Notes: Continue with Endocrinology at [facility]; patient will schedule an  appointment with an Endocrinologist in [place] when she moves.    ---        **2\. Fibromyalgia**    Notes: Patient will call with dose of Cymbalta.        **3\. Fatigue**    _LAB: CBC (INCLUDES DIFF/PLT)_    _LAB: LYME AB SCREEN_    Notes: Most likely related to viral illness. Patient will call if not feeling  better in the next few days. Continue with rest and fluids.        **4\. Routine adult health maintenance**    Notes: Patient will schedule clinical  breast exam, pelvic exam, and Pap test  with GYN. Patient has labs done at Endocrinologist.    Electronically signed by Azucena Fallen , APGNP on 03/29/2016 at 04:56 PM  EDT    Sign off status: Completed        * * *        [facility]    [address]    [place], [place] [address]    Tel: [phone]    Fax: [phone]              * * *          Patient: Angela [name] DOB: Feb 12, 1991 Progress Note: [name] Deshundra Waller,  APGNP. 03/29/2016    ---    Note generated by eClinicalWorks EMR/PM Software ([email or web address])

## 2016-03-31 LAB — HX LYME ANTIBODIES W/ RFLX IB
CASE NUMBER: 2017220001733
HX LYME ABS TOT: 0.31 {index}

## 2016-07-28 ENCOUNTER — Ambulatory Visit

## 2016-07-28 NOTE — Progress Notes (Signed)
* * *        **Angela [name]    --- ---    35 Y old Female, DOB: [birth year]    Account Number: [id number]    [address] [place], [id number]    Home: [phone]    Guarantor: Angela [name] Insurance: [employer] Payer ID: PAPER    PCP: Howell Pringle    Appointment Facility: [facility]        * * *    07/28/2016  Progress Notes: [name] Demetric Parslow, APGNP.    --- ---    ---        Current Medications    ---    Taking     * NovoLog 100 UNIT/ML Solution Subcutaneous     ---    * Lantus 100 UNIT/ML Solution 25 units Subcutaneous at bedtime    ---    Discontinued    * Duloxetine HCl N/A Capsule Delayed Release Particles , Notes: Patient is not sure of dosage    ---    * Medication List reviewed and reconciled with the patient    ---      Active Problem List    ---      E10.9      Type 1 diabetes        --- ---    M79.7      Fibromyalgia        --- ---    R53.83      Fatigue        --- ---    Z00.00      Routine adult health maintenance        --- ---    F41.0      Severe anxiety with panic        --- ---      Family History    ---      Father: alive, diagnosed with Hypertension    ---    Mother: alive, diagnosed with Hypertension    ---      Social History    ---     _Quality 2017:_    no Smoking Are you a: never smoker.      Allergies    ---      Tetracycline HCl    ---      Review of Systems    ---    All other systems were reviewed and were negative except per HPI.        Reason for Appointment    ---      1\. Anxiety and depression.    ---      History of Present Illness    ---     _._ :    The patient presents today for an acute visit due to worsening anxiety and  depression. The patient reports she moved to [facility] two months ago and  has had increase in both depression and anxiety since then. She is having a  difficult time at her new job due to her boss. She does feel a little bit  isolated as well. The patient reports decreased appetite. She denies  any  suicidal or homicidal ideations. The patient had such severe anxiety that she  did fly home from [place] to be with her family. She does plan on starting  therapy once she returns to [facility] next week.    The patient stopped Duloxetine a few months ago, which was  prescribed due to  fibromyalgia. She did not think it was helping with her mood and she has had  no increase in the fibromyalgia pain since stopping that.    The patient is a type 1 diabetic and is followed by [facility]  Endocrinology. She reports her blood sugars have been under good control.      Vital Signs    ---    Wt 132, Ht 68, BMI 20.07, HR 90, BP 110/70.      Physical Examination    ---     _GENERAL_ :    Appears stated as: yes. Build: normal . Eye contact: normal. General  Appearance: no acute distress. Hygiene: good. Ill-appearance: none. Mental  Status: alert and oriented. Mood/Affect: pleasant, sad, tearful, anxious.  Race: caucasion. Speech: clear.    _HEENT_ :    Eyes: non-icteric sclera, conjunctiva and sclera slightly injected due to  crying. Head: atraumatic, normocephalic.    _NECK_ :    Jugular venous distension: none. Neck supple.    _LUNGS_ :    Auscultation: CTA bilaterally. Effort: no respiratory distress.    _HEART_ :    Edema: trace LLE edema. Heart sounds: normal. Murmurs: none. Rate: normal.  Rhythm: regular.    _ABDOMEN_ :    Tenderness: none.    _NEUROLOGICAL_ :    Cranial Nerves: CN's II-XII grossly intact. Gait: normal.    _SKIN_ :    Color: good. General: warm, moist.    _PSYCHOLOGY_ :    Insight: normal . Judgement: normal . Memory: good .          Assessments    ---    1\. Severe anxiety with panic - F41.0 (Primary)    ---    2\. 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 Type 1 diabetes - E10.9    ---    4\. Fibromyalgia - M79.7    ---      Treatment    ---       **1\. Severe anxiety with panic**    Start Xanax Tablet, 0.25 MG, 0.5 tablet as needed,  Orally, Twice a day, 30  days, 30 Tablet, Refills 0    Notes: Patient education provided regarding risk of dependence and sedating  effects of Xanax. Patient is aware that it is used short-term until SSRI  becomes effective.    ---        **2\. Current moderate episode of major depressive disorder without prior  episode**    Start Citalopram Hydrobromide Tablet, 10 mg, 0.5 tablet x 6 days, then 1  tablet, Orally, Once a day, 30 day(s), 30, Refills 1    Notes: 10 pound unintentional weight loss noted. Patient will be starting  therapy.        **3\. Type 1 diabetes**    Notes: Followed by [facility] Endocrinology.        **4\. Fibromyalgia**    Notes: No increase in pain since stopping Cymbalta.    Electronically signed by Azucena Fallen , APGNP on 08/04/2016 at 01:31 PM  EST    Sign off status: Completed        * * *        [facility]    [address]    [place], [place] [address]    Tel: [phone]    Fax: [phone]              * * *  Patient: Angela [name], Angela [name] DOB: [birth year] Progress Note: [name] Joscelyne Renville,  APGNP. 07/28/2016    ---    Note generated by eClinicalWorks EMR/PM Software ([email or web address])

## 2017-03-28 ENCOUNTER — Ambulatory Visit

## 2017-03-28 NOTE — Progress Notes (Signed)
* * *        **Angela [name]    --- ---    27 Y old Female, DOB: 1991-08-12    Account Number: [id number]    [address] [place], [place]    Home: [phone]    Guarantor: Angela [name] Insurance: [employer]emnity Payer ID: [id number]    PCP: Howell Pringle    Appointment Facility: [facility]        * * *    03/28/2017  Progress Notes: [name] Raynor Calcaterra, APGNP.    --- ---    ---        Current Medications    ---    Taking     * NovoLog 100 UNIT/ML Solution Subcutaneous     ---    * Lantus 100 UNIT/ML Solution 25 units Subcutaneous at bedtime    ---    * Duloxetine HCl 60 MG Capsule Delayed Release Particles 1 capsule Orally once a day    ---    * MetFORMIN HCl ER 500 mg Tablet Extended Release 24 Hour 1 tablet Orally twice a day (bid)    ---    * OneTouch Verio Flex System w/Device Kit USE METER 3 TO 4 TIMES A DAY     ---    Not-Taking/PRN    * Xanax 0.25 MG Tablet 0.5 tablet as needed Orally Twice a day    ---    Discontinued    * Zolpidem Tartrate 5 MG Tablet (Schedule IV Drug) TAKE 1 TABLET BY MOUTH AT BEDTIME AS NEEDED FOR SLEEP Oral     ---    * Hydrocodone-Acetaminophen 10-325 MG Tablet (Schedule II Drug) TAKE 1 TABLET BY MOUTH 3 TIMES A DAY AS NEEDED Oral     ---    * Citalopram Hydrobromide 10 mg Tablet 0.5 tablet x 6 days, then 1 tablet Orally Once a day    ---    * Medication List reviewed and reconciled with the patient    ---      Past Medical History    ---       No Medical History.Marland Kitchen        ---      Surgical History    ---      No Surgical History documented.    ---      Family History    ---      Father: alive, diagnosed with Hypertension    ---    Mother: alive, diagnosed with Hypertension    ---      Social History    ---     _Quality 2017:_    no Smoking Are you a: never smoker. Smoking Cessation/Exceptions Smoking  Status Reviewed: 03/28/2017. Fall Risk Assessment Fall Risk Assessment: No  falls in the past year. BMI Follow-up Plan BMI LAST COMPLETED:  03/28/2017  Normal BMI.    _Drug/Alcohol:_    no Drugs . Alcohol: yes Did you have a drink containing alcohol in the past  year? Yes, How often did you have a drink containing alcohol in the past year?  Monthly or less (1 point), How many drinks did you have on a typical day when  you were drinking in the past year? 1 or 2 (0 points), How often did you have  six or more drinks on one occasion in the past year? Never (0 points), Points  1, Interpretation Negative.      Allergies    ---  Tetracycline HCl    ---    Neurontin: nausea: Side Effects    ---      Hospitalization/Major Diagnostic Procedure    ---      No Hospitalization History.    ---      Review of Systems    ---    All other systems were reviewed and were negative except per the history of  present illness.        Reason for Appointment    ---      1\. Fibromylagia    ---      History of Present Illness    ---     _Depression Screening_ :    PHQ-2 Little interest or pleasure in doing things No, Feeling down, depressed,  or hopeless Yes. PHQ-9 Thoughts that you would be better off dead, or of  hurting yourself in some way? Not at all, Total Score: 21, Interpretation  Severe Depression.    The patient presents today for an acute visit. She is accompanied to today's  visit by her mother who assists with the history.    The patient's mother reports that they flew the patient back from [place]  two days ago because, "she was not doing well in [place]."    The patient reports that she has been having a fibromyalgia flare. She thinks  that the fibromyalgia flare was triggered by increased exercise. The patient  reports generalized widespread pain and stiffness.    The patient's mother reports that the patient has started to be treated with  myofascial release massage. They are also looking into having the patient  treated at [facility] because there is a [facility]  there.    The patient states that she cannot function during  the day and she cannot  sleep at night.    The patient also reports severe anxiety and depression.    The patient denies any suicidal thoughts or ideations. She also denies any  homicidal thoughts or ideations.    The patient has been followed by a physician in [place] who prescribed the  patient Ambien on 03/24/2017. The patient states that she took that once and  then stopped it because she found it ineffective.    The patient has been taking Duloxetine 60 mg daily for three weeks and has not  noticed a significant difference in the fibromyalgia pain as of yet.    The patient was prescribed #90 of Norco 10/325 mg (one month supply) on  03/07/2017 and the patient has none left. She states no other medication, such  as Tylenol or Ibuprofen, helps the pain except Norco.    The patient requests a refill of Metformin 500 mg twice a day.      Vital Signs    ---    Wt 139, Ht 68, BMI 21.13, Temp 97.9-Tympanic, HR 120, BP 110/76, RR 14, SaO2  98% (RA).      Physical Examination    ---     _GENERAL_ :    Appears stated as: yes. Build: normal . Eye contact: normal. General  Appearance: appears uncomfortable and stiff. Hygiene: good. Ill-appearance:  none. Mental Status: alert and oriented. Mood/Affect: sad, tearful, anxious.  Race: caucasion. Speech: clear.    _HEENT_ :    Eyes: non-icteric sclera. Head: atraumatic, normocephalic.    _NECK_ :    Jugular venous distension: none. Neck supple.    _LUNGS_ :    Auscultation: CTA bilaterally. Effort: no respiratory  distress.    _HEART_ :    Edema: trace LLE edema. Heart sounds: normal. Murmurs: none. Rate: normal.  Rhythm: regular.    _NEUROLOGICAL_ :    Cranial Nerves: CN's II-XII grossly intact. Gait: slow.    _SKIN_ :    Color: good. General: warm, moist.    _PSYCHOLOGY_ :    Memory: good .    Discomfort with any palpation of back, arms, and legs.      Assessments    ---    1\. Fibromyalgia - M79.7 (Primary)    ---    2\. Severe anxiety with panic - F41.0    ---    3\. Type  1 diabetes - E10.9    ---      Treatment    ---       **1\. Fibromyalgia**    Start Lyrica Capsule, 25 MG, 1 capsule, Orally, Twice a day, 30 days, 60  Capsule, Refills 0    Refill Duloxetine HCl Capsule Delayed Release Particles, 60 MG, 1 capsule,  Orally, Once a day, 30 days, 30 Capsule, Refills 1    Decrease Hydrocodone-Acetaminophen Tablet, 5-325 MG, 1 tablet as needed,  Orally, three times a day (tid), 10 days, 30, Refills 0    Notes: Agree with the patient being seen by a provider who specializes in  fibromyalgia. The patient has tried Neurontin in the past and did not feel it  was effective and also experienced nausea with it. We will try a low dose of  Lyrica. Patient and caregiver education provided that opioids are not  indicated for the treatment of fibromyalgia. She also overtook how much was  prescribed for her by her physician in [place]. I will prescribe a lower  dose of the Norco and follow the patient closely. She will follow-up in one  week. She verbalizes understanding.    ---        **2\. Severe anxiety with panic**    Refill Xanax Tablet, 0.25 MG, 0.5 tablet as needed, Orally, once a day, 30  days, 15 Tablet, Refills 0    Notes: Emotional support was provided. The patient and her mother are aware  that they should call the Crisis Line or go to the emergency department if the  patient experiences any suicidal or homicidal thoughts or ideations. They are  trying to establish care with a psychiatric provider. They are aware that  Xanax is used in the short-term for acute issues only.        **3\. Type 1 diabetes**    Refill MetFORMIN HCl ER Tablet Extended Release 24 Hour, 500 mg, 1 tablet,  Orally, twice a day (bid), 30 days, 60, Refills 1    Notes: The patient was followed by Endocrinology in [place]. Her previous  endocrinologist was Myles Rosenthal at [facility]. I will refill the  Metformin until she is seen by an endocrinologist.    Electronically signed by Azucena Fallen , APGNP  on 03/30/2017 at 04:50 PM  EDT    Sign off status: Completed        * * [facility]    [address]    [place], [place] [address]    Tel: [phone]    Fax: [phone]              * * *          Patient: Angela [name], Angela [name] DOB: [birth year] Progress Note: [name] Ashtan Girtman,  APGNP. 03/28/2017    ---    Note generated by eClinicalWorks EMR/PM Software ([email or web address])

## 2017-03-29 ENCOUNTER — Ambulatory Visit

## 2017-03-29 NOTE — Progress Notes (Signed)
* * *        **  [name]**    --- ---    80 Y old Female, DOB: 21-May-1991    [address] [place], [place], [name] [address]    Home: [phone]    Provider: Azucena Fallen        * * *    Telephone Encounter    ---    Answered by   Azucena Fallen  Date: 03/29/2017         Time: 05:17 PM    Reason   Pain update    --- ---            Message                      Patient's mother came to the office to pick up the Norco prescription this morning.  Concerned about patient's pain level and requested I call her back.                  Action Taken   Jye Fariss,[name] 03/29/2017 5:19:38 PM > I spoke to patient's  mother. Started Lyrica. Patient will be seen at the [facility] in  9/18. Deborha Moseley,[name] 03/29/2017 5:26:50 PM > Patient's mother states the  patient is continuing with myofascial release, to start acupuncture next week.  Ellen Henri has Psychiatry, as well. Janella Rogala,[name] 03/29/2017 5:31:23 PM >  Advised patient's mother I won't increase Norco dose at this time; she  verbalizes understanding. Advised that if patient's pain is unbearable, she  should be seen in ED. Patient's mother or patient will call with an update on  03/31/17.                * * *                ---          * * *          Patient: Joellyn, Grandt DOB: 03-17-1991 Provider: Azucena Fallen  03/29/2017    ---    Note generated by eClinicalWorks EMR/PM Software ([email or web address])

## 2017-03-30 ENCOUNTER — Ambulatory Visit: Admitting: Internal Medicine

## 2017-03-30 ENCOUNTER — Ambulatory Visit

## 2017-03-30 NOTE — Progress Notes (Signed)
* * *        **  Angela [name]**    --- ---    89 Y old Female, DOB: Jul 18, 1991    [address] [place], [place], [name] [address]    Home: [phone]    Provider: Howell Pringle        * * *    Telephone Encounter    ---    Answered by   Catha Nottingham  Date: 03/30/2017         Time: 01:58 PM    Reason   PT OT Orders    --- ---            Action Taken   [facility] 03/30/2017 1:59:55 PM > PT OT Orders faxed                * * *              * * *        ---        Reason for Appointment    ---      1\. PT OT Orders    ---      Allergies    ---      Tetracycline HCl    ---    Neurontin: nausea: Side Effects    ---      Assessments    ---    1\. Fibromyalgia - M79.7    ---      Treatment    ---       **1\. Fibromyalgia**    _LAB: Physical Therapy eval and Tx (Ordered for 03/30/2017)_      [facility] 03/30/2017 1:58:59 PM > To be performed by a pain Specialist    --- ---        _LAB: Occupational Therapy eval and Tx (Ordered for 03/30/2017)_    [facility] 03/30/2017 1:59:11 PM > To be performed by a pain Specialist    --- ---          * * *          Patient: Angela [name] DOB: 05-26-1991 Provider: Howell Pringle  03/30/2017    ---    Note generated by eClinicalWorks EMR/PM Software ([email or web address])

## 2017-03-30 NOTE — Progress Notes (Signed)
* * *        **[name]    --- ---    43 Y old Female, DOB: 11/14/1990    [address] [place], [place], [name] [address]    Home: [phone]    Provider: Azucena Fallen        * * *    Telephone Encounter    ---    Answered by   [name]  Date: 03/30/2017         Time: 12:28 PM    Caller   Jerene Dilling    --- ---            Reason   PT/OT eval order for [facility] called [facility] needs an order for PT/OT pain evaluation for [facility]. I have confirmed patient does not need referral and I am waiting for [facility] to call back to clarify what they need for an order from [name] as well as anything else they may require.  XContact at [place] is Val [phone]  fax [phone]                Action Taken   [name],[name] 03/30/2017 1:55:05 PM > Last office visit note. 1  Order Physical Therapy and 1 Order for Occupational Therapy "to be performed  by a pain specialist. Need MD to do the orders. [name],[name] 03/30/2017  1:55:37 PM > Dr Darrick Penna will write the orders for OT and PT today.  [name],[name] 03/30/2017 2:02:30 PM > orders signed and faxed to Val just  awaiting Megans note to be locked from 03/28/17 [name],[name] 03/31/2017  8:39:03 AM > progress note faxed this am [name],[name] 03/31/2017 8:39:20 AM  > [name] this is just FYI [name],[name] 03/31/2017 9:00:10 AM > Update on  Patient from mother Jerene Dilling. Yesterday was a better day took 10mg  of Vicodin at  7am, 5pm and at midnight to go to bed. She took again at 7am this morning. She  is sleeping mostly in am not evening. Continues to take Lyrica and Cymbalta  not using Xanax at all. She has increased Lyrica at 50 mg and will continue  that 50 mg am and 50 mg pm. She has appointments next week with [facility]  Monday - OT, Tuesday -PT and Wednesday the psychologist. She has app with MD  9/10 and on wait list to be moved up. Continue with myofascial release therapy  and that helps  especially the home therapy. She has appointments for these  therapies this week and next. Would like to talk with [name] about about  Vicodin she is doing 3 a day at 10mg  and hoping to drop doses but worried she  wont have enough until next Thursday when she sees [place]. I did let her know  that she would need to talk with [name] direct about this. She would only make  it through Saturday using 3 times a day. Kavin Weckwerth,[name] 03/31/2017 3:26:09  PM > I returned the patient's mother's call with Phillis Knack,  present. She states that patient seemed "a little better yesterday." The  patient has been having myofascial massages. She has an OT appointment on  04/03/17, a PT appointment on 04/04/17, and a Psychiatry appointment on  04/05/17-all at [facility]. I advised the patient's mother that she  should not be adjusting the  patient's medications without discussing it with  me first. I also advised that I will not fill any of the medications early as  I already did for the Norco because the patient had run out of the Norco that  she was prescribed from the physician in [place]. If the patient is  experiencing intractable pain, the patient will go to the ED. She verbalizes  understanding.                * * *                ---          * * *          Patient: Tharon, Kitch DOB: [birth year] Provider: Azucena Fallen  03/30/2017    ---    Note generated by eClinicalWorks EMR/PM Software ([email or web address])

## 2017-04-05 LAB — BMP (EXT)
Anion Gap (EXT): 19 mmol/L — ABNORMAL HIGH (ref 3–17)
BUN (EXT): 26 mg/dL — ABNORMAL HIGH (ref 8–25)
CO2 (EXT): 24 mmol/L (ref 23–32)
CalciumCalcium (EXT): 9.6 mg/dL (ref 8.5–10.5)
Chloride (EXT): 94 mmol/L — ABNORMAL LOW (ref 98–108)
Creatinine (EXT): 0.64 mg/dL (ref 0.60–1.50)
GFR Estimated (Calc) (EXT): >120 mL/min/{1.73_m2} (ref >59)
Glucose (EXT): 120 mg/dL — ABNORMAL HIGH (ref 70–110)
Potassium (EXT): 4.1 mmol/L (ref 3.4–5.0)
Sodium (EXT): 137 mmol/L (ref 135–145)

## 2017-04-06 ENCOUNTER — Ambulatory Visit

## 2017-04-06 NOTE — Progress Notes (Signed)
* * *        **  Angela [name]**    --- ---    56 Y old Female, DOB: 29-Mar-1991    [address] [place], [place], [name] [address]    Home: [phone]    Provider: Azucena Fallen        * * *    Telephone Encounter    ---    Answered by   Rico Ala  Date: 04/06/2017         Time: 02:11 PM    Reason   RX    --- ---            Message                      Patient father called and said that patient is taking 100mg  a day. 50MG  in the morning and 50mg  in the evening. Megan needed the Rx info.                Action Taken   [facility] 04/06/2017 2:14:53 PM > send to NP Megan  Aseret Hoffman,[name] 04/06/2017 2:46:32 PM > Patient is prescribed 25 MG TWICE A  DAY. Please tell patient/parents again to stop adjusting medications. Decrease  back to 25 mg twice a day.                * * *                ---          * * *          Patient: Angela [name], Angela [name] DOB: November 30, 1990 Provider: Azucena Fallen  04/06/2017    ---    Note generated by eClinicalWorks EMR/PM Software ([email or web address])

## 2017-04-06 NOTE — Progress Notes (Signed)
* * *        **Angela [name]    --- ---    68 Y old Female, DOB: 01-Nov-1990    Account Number: [id number]    [address] [place], [place]    Home: [phone]    Guarantor: Angela [name] Insurance: [employer] Payer ID: [id number]    PCP: Howell Pringle    Appointment Facility: [facility]        * * *    04/06/2017  Progress Notes: [name] Tomasz Steeves, APGNP.    --- ---    ---        Current Medications    ---    Taking     * MetFORMIN HCl ER 500 mg Tablet Extended Release 24 Hour 1 tablet Orally twice a day (bid)    ---    * Xanax 0.25 MG Tablet 0.5 tablet as needed Orally once a day    ---    * Lyrica 25 MG Capsule 1 capsule Orally Twice a day    ---    * Duloxetine HCl 60 MG Capsule Delayed Release Particles 1 capsule Orally Once a day    ---    * Hydrocodone-Acetaminophen 5-325 MG Tablet 1 tablet as needed Orally three times a day (tid), stop date 04/07/2017    ---    * NovoLog 100 UNIT/ML Solution Subcutaneous     ---    * Lantus 100 UNIT/ML Solution 25 units Subcutaneous at bedtime    ---    * OneTouch Verio Flex System w/Device Kit USE METER 3 TO 4 TIMES A DAY     ---    * Nitrofurantoin Monohyd Macro 100 MG Capsule 1 capsule with food Orally every 12 hrs    ---    * Medication List reviewed and reconciled with the patient    ---      Past Medical History    ---       No Medical History.Marland Kitchen        ---      Surgical History    ---      No Surgical History documented.    ---      Family History    ---      Father: alive, diagnosed with Hypertension    ---    Mother: alive, diagnosed with Hypertension    ---      Social History    ---     Sharion Settler 2017:_    Post-Discharge Med Rec Medication Reconciliation: Reconciled current and  discharge medications. no Smoking Are you a: never smoker. Smoking  Cessation/Exceptions Smoking Status Reviewed: 03/28/2017. Fall Risk Assessment  Fall Risk Assessment: No falls in the past year. BMI Follow-up Plan BMI LAST  COMPLETED:  03/28/2017 Normal BMI.      Allergies    ---      Tetracycline HCl    ---    Neurontin: nausea: Side Effects    ---      Hospitalization/Major Diagnostic Procedure    ---      Pain, UTI-[facility] ED 03/2017    ---      Review of Systems    ---    All other systems were reviewed and were negative except per the history of  present illness.        Reason for Appointment    ---      1\. Follow-up    ---  History of Present Illness    ---     _*._ :    The patient presents today for a followup visit. She is accompanied by her  father, [name].    The patient has been seen by the physical therapist, occupational therapist,  and the psychotherapist at [facility] this week. She is waiting to see  the physician who specializes in fibromyalgia; that appointment is not until  05/01/2017.    The patient went to the [facility] Emergency Department yesterday  due to ongoing widespread body pain and due to increasing lower back pain. She  was noted to have a urinary tract infection and was started on Macrobid. The  patient received six hydrocodone 10-300 tablets at [facility].    This is confirmed through [facility] and by calling the patient's pharmacy and  reviewing the [facility] ED note.    The patient reports that she is still having widespread pain in her neck,  back, arms, and legs. She feels like she is having some blurred vision, which  is new.    The patient is not sure how much Lyrica she is taking but she thinks that she  is taking two capsules twice a day, and the direction is for one capsule twice  a day. The patient's father states that he will confirm that dose and let me  know, as it could be contributing to the blurred vision. The patient also  reports dizziness and lightheadedness. The patient's blood pressure is low  today, at 90/80, and repeat was 90/70.    The patient reports that she is adherent with checking her capillary blood  sugars and they have all been "good".    The patient's father  spent much of the visit advocating for the patient's  hydrocodone to be increased back to 10 mg three times a day. She states that  she wants to break the pain cycle and does not want to see his daughter  suffering. He is stating that she took the hydrocodone for two months, three  years ago, and then was able to stop it. He states that he is less concerned  about addiction than the pain.    The patient is also being referred to a rheumatologist at [facility].      Vital Signs    ---    Ht 68, HR 123, BP 90/80, RR 14, SaO2 98% (RA), Repeat BP 90/70.      Physical Examination    ---     _GENERAL_ :    Appears stated as: yes. Build: normal . Eye contact: normal. General  Appearance: appears uncomfortable and stiff. Hygiene: good. Ill-appearance:  none. Mental Status: alert and oriented. Mood/Affect: sad, tearful, anxious.  Race: caucasion. Speech: clear.    _HEENT_ :    Eyes: non-icteric sclera, conjunctiva clear. Head: atraumatic, normocephalic.    _NECK_ :    Jugular venous distension: none. Neck supple.    _LUNGS_ :    Auscultation: CTA bilaterally. Effort: no respiratory distress.    _HEART_ :    Edema: trace LLE edema. Heart sounds: normal. Murmurs: none. Rate: mild  tachycardia-110. Rhythm: regular.    _NEUROLOGICAL_ :    Cranial Nerves: CN's II-XII grossly intact. Gait: slow.    _SKIN_ :    Color: pale. General: warm, moist.    _PSYCHOLOGY_ :    Memory: good .    _BACK_ :    Lower back pain: yes.  Discomfort with any palpation of back, arms, and legs.      Assessments    ---    1\. Fibromyalgia - M79.7 (Primary)    ---    2\. Pain - R52    ---    3\. Type 1 diabetes - E10.9    ---    4\. Severe anxiety with panic - F41.0    ---    5\. Urinary tract infection without hematuria, site unspecified - N39.0    ---    6\. Other specified hypotension - I95.89    ---      Treatment    ---       **1\. Fibromyalgia**    Refill Hydrocodone-Acetaminophen Tablet, 10-325 MG, 1 tablet as needed,  Orally, three  times a day (tid), 15 days, 45, Refills 0    Notes: Controlled medication agreement was signed. Advised the patient that  there will be no earlier refills of the Norco and she is not to obtain this  medication or any other controlled substances at any other facility unless the  pain specialist takes over. She and her father verbalized understanding. We  will discuss with Dr. Darrick Penna as well. Emotional support was provided.    Father is to contact the office with the Lyrica dose.    ---        **2\. Type 1 diabetes**    Notes: The patient has been following her capillary blood sugars and states  that they are in the normal range. Consider checking Hgb A1c. The patient will  need to re-establish care with an endocrinologist.        **3\. Severe anxiety with panic**    Stop Xanax Tablet, 0.25 MG, 0.5 tablet as needed, Orally, once a day    Notes: Stop the Xanax. The patient was only using it sparingly, but she will  not be taking it with a low blood pressure.        **4\. Urinary tract infection without hematuria, site unspecified**    Notes: Continue the Macrobid. We will await sensitivities.        **5\. Other specified hypotension**    Notes: Most likely is related to hypokalemia. Advised the patient to increase  fluids, even though she states she thinks she is drinking enough. The patient  is sweating a lot. The patient's father will purchase a blood pressure cuff to  monitor blood pressures at home. They are aware that, if the blood pressure  goes really any lower or if she becomes more systematic, she should be  evaluated in the emergency department. We will await the labs from [facility] and check labs tomorrow, if needed.        **6\. Others**    Notes: Over 45 minutes was spent with the patient.    Electronically signed by Azucena Fallen , APGNP on 05/02/2017 at 07:03 PM  EDT    Sign off status: Completed        * * [facility]    [address]    [place], [place]  [address]    Tel: [phone]    Fax: [phone]              * * *          Patient: Yonna, Alwin DOB: 04-13-1991 Progress Note: [name] Vincenza Dail,  APGNP. 04/06/2017    ---    Note generated by eClinicalWorks EMR/PM Software ([email or web address])

## 2017-04-07 ENCOUNTER — Ambulatory Visit

## 2017-04-07 NOTE — Progress Notes (Signed)
* * *        **  Angela [name]**    --- ---    3 Y old Female, DOB: Mar 08, 1991    [address] [place], [place], [name] [address]    Home: [phone]    Provider: Azucena Fallen        * * *    Telephone Encounter    ---    Answered by   Azucena Fallen  Date: 04/07/2017         Time: 12:53 PM    Reason   Follow-up appointment    --- ---            Message                      Needs follow-up on 8/31.        Will be "due" for Norco prescription that day and can pick up after appointment.                  Action Taken   [name],[name] 04/07/2017 12:54:28 PM > To Jennie B.  [name],[name] 04/11/2017 11:54:45 AM > LM for pt to call me back to schedule  appointment [name],[name] 04/12/2017 2:48:26 PM > LM for pt to call back an  schedule appointment with [name] [name],[name] 04/12/2017 4:41:40 PM >  patients mother made follow up with [name] for 8/31                * * *                ---          * * *          Patient: Angela [name] DOB: 07-May-1991 Provider: Azucena Fallen  04/07/2017    ---    Note generated by eClinicalWorks EMR/PM Software ([email or web address])

## 2017-04-07 NOTE — Progress Notes (Signed)
* * *        **Angela [name]    --- ---    70 Y old Female, DOB: [birth year]    Account Number: [id number]    [address] [place], [place]    Home: [phone]    Guarantor: Angela [name] Insurance: [employer] Payer ID: [id number]    PCP: Howell Pringle    Appointment Facility: [facility]        * * *    04/07/2017  Progress Notes: [name] Alfard Cochrane, APGNP.    --- ---    ---        Current Medications    ---    Taking     * MetFORMIN HCl ER 500 mg Tablet Extended Release 24 Hour 1 tablet Orally twice a day (bid)    ---    * Lyrica 25 MG Capsule 1 capsule Orally Twice a day    ---    * Duloxetine HCl 60 MG Capsule Delayed Release Particles 1 capsule Orally Once a day    ---    * NovoLog 100 UNIT/ML Solution Subcutaneous     ---    * Lantus 100 UNIT/ML Solution 25 units Subcutaneous at bedtime    ---    * OneTouch Verio Flex System w/Device Kit USE METER 3 TO 4 TIMES A DAY     ---    * Nitrofurantoin Monohyd Macro 100 MG Capsule 1 capsule with food Orally every 12 hrs    ---    * Hydrocodone-Acetaminophen 10-325 MG Tablet 1 tablet as needed Orally three times a day (tid), stop date 04/21/2017    ---    * Advil 200 MG Tablet 1 tablet with food or milk as needed Orally Three times a day    ---    * Medication List reviewed and reconciled with the patient    ---      Past Medical History    ---       No Medical History.Marland Kitchen        ---      Surgical History    ---      No Surgical History documented.    ---      Family History    ---      Father: alive, diagnosed with Hypertension    ---    Mother: alive, diagnosed with Hypertension    ---      Social History    ---     Sharion Settler 2017:_    Post-Discharge Med Rec Medication Reconciliation: Reconciled current and  discharge medications. no Smoking Are you a: never smoker. Smoking  Cessation/Exceptions Smoking Status Reviewed: 04/07/2017. Fall Risk Assessment  Fall Risk Assessment: No falls in the past year. BMI Follow-up Plan BMI  LAST  COMPLETED: 04/07/2017 Normal BMI.      Allergies    ---      Tetracycline HCl    ---    Neurontin: nausea: Side Effects    ---      Hospitalization/Major Diagnostic Procedure    ---      Pain, UTI-[facility] ED 03/2017    ---      Review of Systems    ---    All other systems were reviewed and were negative except per the history of  present illness.        Reason for Appointment    ---      1\. Follow-up visit    ---  History of Present Illness    ---     _*._ :    The patient presents today for a followup visit. She is accompanied to today's  visit by her mother and father, who are very involved and supportive in the  patient's care.    The patient was seen yesterday and her blood pressure was noted to be very low  at 90/80 initially and then repeat blood pressure was 90/70. The patient was  also tachycardic. The patient's father called the office yesterday afternoon  confirming that the Lyrica dose that they have been administering to the  patient was 50 mg twice a day. The patient is prescribed 25 mg twice a day.  Nursing placed a phone call to the patient advising her not to take 50 mg  twice a day and the patient and parents confirmed today have reduced the  medication back to 25 mg twice a day.    The patient reports that she was feeling better than yesterday. She has been  increasing her fluids as advised.    The patient does still report pain, especially in her legs from the knees and  below.    She does notice increased sweating, especially on her abdomen and back. The  patient continues on Macrobid for UTI. The patient reports that her hemoglobin  A1c was checked in July at [facility] [place]. She states that  she was not told what the number was, but that was very bad. She expects it to  be better now because she is closely monitoring her capillary blood sugars and  she is agreement with checking at the end of this month.    The patient's mother request the patient be checked for  tick-borne illnesses  because of the fatigue and pain in case it is not just fibromyalgia flare. The  patient's father had a diagnosis of Lyme disease per report.    The patient also reports that she was told she had enlarged thyroid in  [place], but never went for thyroid ultrasound and she will be interested  in having this done. The patient does still have an elevated heart rate.  Apical pulse is 120. She reports occasional chest pain, which she attributes  to anxiety. She denies palpitations or shortness of breath.      Vital Signs    ---    Wt 133, Ht 68, BMI 20.22, Temp 97.1-Tympanic, HR 117, BP 110/80, RR 14.      Physical Examination    ---     _GENERAL_ :    Appears stated as: yes. Build: normal . Eye contact: normal. General  Appearance: appears less uncomfortable and stiff. Hygiene: good. Ill-  appearance: none. Mental Status: alert and oriented. Mood/Affect: less sad,  tearful, and anxious. Race: caucasion. Speech: clear.    _HEENT_ :    Eyes: non-icteric sclera, conjunctiva clear. Head: atraumatic, normocephalic.    _NECK_ :    Jugular venous distension: none. Neck supple.    _LUNGS_ :    Auscultation: CTA bilaterally. Effort: no respiratory distress.    _HEART_ :    Edema: trace LLE edema. Heart sounds: normal. Murmurs: none. Rate:  tachycardic-120. Rhythm: regular.    _NEUROLOGICAL_ :    Cranial Nerves: CN's II-XII grossly intact. Gait: slow.    _SKIN_ :    Color: color is better today. General: warm, moist.    _PSYCHOLOGY_ :    Memory: good .    _BACK_ :  Lower back pain: yes; improved. Spine: normal spine curvature.    Discomfort with any palpation of back, arms, and legs.      Assessments    ---    1\. Myalgia - M79.1 (Primary)    ---    2\. Thyromegaly - E01.0    ---    3\. Tachycardia - R00.0    ---    4\. Fibromyalgia - M79.7    ---    5\. Type 1 diabetes - E10.9    ---    6\. Severe anxiety with panic - F41.0    ---    7\. Other specified hypotension - I95.89    ---    8\. Urinary tract  infection without hematuria, site unspecified - N39.0    ---      Treatment    ---       **1\. Myalgia**    _LAB: LYME AB SCREEN_    _LAB: ANAPLASMA PHAGOCYTOPHILUM AB (IGG,IGM)_    _LAB: BABESIA MICROTI (IGG,IGM)_    Notes: We will check for tick-borne illnesses.    ---        **2\. Thyromegaly**    _IMAGING: Ultrasound : Neck_    Notes: We will check neck ultrasound.        **3\. Tachycardia**    _IMAGING: EKG-Electrocardiogram_    Notes: We will check an EKG. We will hold off on a beta-blocker for now due to  episodes of hypotension.        **4\. Fibromyalgia**    Notes: No change in medications for this. The patient and parents are aware  not to adjust any other medications without checking provider in this office.  The patient will be due for refill of Norco on 04/21/2017, advised the patient  and her parents to schedule an appointment for that day and she could obtain  the prescription at that same time. They verbalized understanding.        **5\. Type 1 diabetes**    Notes: Check A1c and urine for microalbumin at end of this month.        **6\. Severe anxiety with panic**    Notes: Advised the patient not to take Xanax unless she is actually having  panic attack due to the hypotension.        **7\. Other specified hypotension**    Notes: Improved. Continue with increased fluids. Follow blood pressures; has  arm BP cuff at home.        **8\. Urinary tract infection without hematuria, site unspecified**    Notes: Second UTI in 2 months; first one was treated with Bactrim. Repeat UA  C+S after Macrobid is completed.    Electronically signed by Azucena Fallen , APGNP on 05/02/2017 at 07:03 PM  EDT    Sign off status: Completed        * * [facility]    [address]    [place], [place] [address]    Tel: [phone]    Fax: [phone]              * * *          Patient: Angela [name], Angela [name] DOB: [birth year] Progress Note: [name] Evin Loiseau,  APGNP. 04/07/2017    ---    Note  generated by eClinicalWorks EMR/PM Software ([email or web address])

## 2017-04-09 LAB — HX LYME ANTIBODIES W/ RFLX IB
CASE NUMBER: 2018229001390
HX LYME ABS TOT: 0.24 {index}

## 2017-04-10 ENCOUNTER — Ambulatory Visit

## 2017-04-10 LAB — HX BABESIA MICROTI AB IGG & IGM BY IFA
CASE NUMBER: 2018229001390
HX BABESIA MICROTI IGG: <1:16 {titer}
HX BABESIA MICROTI IGM: <1:20 {titer}

## 2017-04-10 LAB — HX ANAPLASMA PHAGOCYTOPHILUM AB, IGG/IGM
CASE NUMBER: 2018229001390
HX A. PHAGOCYTOPHIL IGG: <1:80 {titer}

## 2017-04-13 ENCOUNTER — Ambulatory Visit

## 2017-04-13 NOTE — Progress Notes (Signed)
* * *        **  Angela [name]**    --- ---    81 Y old Female, DOB: 1991/02/26    [address] [place], [place], [name] [address]    Home: [phone]    Provider: Azucena Fallen        * * *    Telephone Encounter    ---    Answered by   [name]  Date: 04/13/2017         Time: 10:34 AM    [name]    --- ---            Reason   updates            Action Taken   [name],[name] 04/13/2017 10:38:51 AM > [name] jst some updates  from [place] today on [place]. She wanted you to know she is seeing the  psychiatrist at [facility] on 8/27, The rhuematologist at [facility] on 8/28,  Infectious disease at [facility] on 04/26/17 and will also be booking an appointment  with Dr Rush Barer is near future for her knee pain which she has not spoken to  you about but she stated [name] did have a displaced knee cap in past and it  is bothering her. Her insurance does not require referrals- she plans to see  Dr Rush Barer in future for that. Kailer Heindel,[name] 04/16/2017 8:00:23 PM >  Replied to patient                * * *         **eMessages**   From:   [name],[name]    --- ---    Created:   2017-04-16 20:00:15    Sent:   2017-04-16 20:00:25    Subject:   [id number]    Message:                      [name],[name]  04/13/2017 10:38:51 AM >  [name] jst some updates from [place] today on [place].  She wanted you to know  she is seeing the psychiatrist at [facility] on 8/27, The rhuematologist at [facility] on 8/28, Infectious disease at [facility] on 04/26/17 and will also be booking an appointment with Dr Rush Barer is near future for her knee pain which she has not spoken to you about but she stated [name] did have a displaced knee cap in past and it is bothering her.  Her insurance does not require referrals- she plans to see Dr Rush Barer in future for that.          [name], [name] And your parents if they're helping you with messages. :) Thank  you for all the updates. I'm glad you are getting seen so quickly. How are  you  feeling??? I hope you're better! Why are you seeing Infectious Disease-do you  have a known infection or are you concerned about one? Thanks again, [name]                    ---          * * *          Patient: Angela [name], Angela [name] DOB: 07/17/1991 Provider: Azucena Fallen  04/13/2017    ---    Note generated by eClinicalWorks EMR/PM Software ([email or web address])

## 2017-04-17 LAB — BMP (EXT)
Anion Gap (EXT): 16 mmol/L (ref 3–17)
BUN (EXT): 28 mg/dL — ABNORMAL HIGH (ref 8–25)
CO2 (EXT): 25 mmol/L (ref 23–32)
CalciumCalcium (EXT): 9.4 mg/dL (ref 8.5–10.5)
Chloride (EXT): 100 mmol/L (ref 98–108)
Creatinine (EXT): 0.75 mg/dL (ref 0.60–1.50)
GFR Estimated (Calc) (EXT): 111 mL/min/{1.73_m2} (ref >59)
Glucose (EXT): 109 mg/dL (ref 70–110)
Potassium (EXT): 4.1 mmol/L (ref 3.4–5.0)
Sodium (EXT): 141 mmol/L (ref 135–145)

## 2017-04-18 LAB — BMP (EXT)
Anion Gap (EXT): 13 mmol/L (ref 3–17)
BUN (EXT): 15 mg/dL (ref 8–25)
CO2 (EXT): 24 mmol/L (ref 23–32)
CalciumCalcium (EXT): 8.6 mg/dL (ref 8.5–10.5)
Chloride (EXT): 102 mmol/L (ref 98–108)
Creatinine (EXT): 0.58 mg/dL — ABNORMAL LOW (ref 0.60–1.50)
GFR Estimated (Calc) (EXT): >120 mL/min/{1.73_m2} (ref >59)
Glucose (EXT): 150 mg/dL — ABNORMAL HIGH (ref 70–110)
Potassium (EXT): 3.1 mmol/L — ABNORMAL LOW (ref 3.4–5.0)
Sodium (EXT): 139 mmol/L (ref 135–145)

## 2017-04-18 LAB — HEMOGLOBIN A1C
Estimated Average Glucose mg/dL (INT/EXT): 151 mg/dL
Estimated Average Glucose mg/dL (INT/EXT): 163 mg/dL
HEMOGLOBIN A1C % (INT/EXT): 7.3 % — ABNORMAL HIGH (ref 4.3–6.4)
HEMOGLOBIN A1C % (INT/EXT): 6.9 % — ABNORMAL HIGH (ref 4.3–6.4)

## 2017-04-19 ENCOUNTER — Ambulatory Visit

## 2017-04-19 LAB — BMP (EXT)
Anion Gap (EXT): 12 mmol/L (ref 3–17)
BUN (EXT): 7 mg/dL — ABNORMAL LOW (ref 8–25)
CO2 (EXT): 26 mmol/L (ref 23–32)
CalciumCalcium (EXT): 9 mg/dL (ref 8.5–10.5)
Chloride (EXT): 101 mmol/L (ref 98–108)
Creatinine (EXT): 0.58 mg/dL — ABNORMAL LOW (ref 0.60–1.50)
GFR Estimated (Calc) (EXT): >120 mL/min/{1.73_m2} (ref >59)
Glucose (EXT): 191 mg/dL — ABNORMAL HIGH (ref 70–110)
Potassium (EXT): 4.1 mmol/L (ref 3.4–5.0)
Sodium (EXT): 139 mmol/L (ref 135–145)

## 2017-04-19 NOTE — Progress Notes (Signed)
* * *        **  Angela [name]**    --- ---    47 Y old Female, DOB: Oct 31, 1990    [address] [place], [place], [name] [address]    Home: [phone]    Provider: Azucena Fallen        * * *    Telephone Encounter    ---    Answered by   [name]  Date: 04/19/2017         Time: 04:08 PM    Caller   Jerene Dilling    --- ---            Reason   update just FYI            Action Taken   [name],[name] 04/19/2017 4:11:05 PM > Jerene Dilling called just to  give an update that Seward Grater was hospitalized at [facility] over the weekend.  She had a fall/faiting spell upon standing and hit her face. her BP was also  not stable according to [facility]. They expect her to be released from [facility] tomorrow and she will bring her discharge summary to her appointment  Friday. Carlyne Keehan,[name] 04/21/2017 8:59:44 AM > Noted                * * *                ---          * * *          Patient: Angela [name] DOB: [birth year] Provider: Azucena Fallen  04/19/2017    ---    Note generated by eClinicalWorks EMR/PM Software ([email or web address])

## 2017-04-20 LAB — BMP (EXT)
Anion Gap (EXT): 10 mmol/L (ref 3–17)
BUN (EXT): 10 mg/dL (ref 8–25)
CO2 (EXT): 28 mmol/L (ref 23–32)
CalciumCalcium (EXT): 9.3 mg/dL (ref 8.5–10.5)
Chloride (EXT): 100 mmol/L (ref 98–108)
Creatinine (EXT): 0.47 mg/dL — ABNORMAL LOW (ref 0.60–1.50)
GFR Estimated (Calc) (EXT): >120 mL/min/{1.73_m2} (ref >59)
Glucose (EXT): 203 mg/dL — ABNORMAL HIGH (ref 70–110)
Potassium (EXT): 4.2 mmol/L (ref 3.4–5.0)
Sodium (EXT): 138 mmol/L (ref 135–145)

## 2017-04-21 LAB — BMP (EXT)
Anion Gap (EXT): 10 mmol/L (ref 3–17)
BUN (EXT): 14 mg/dL (ref 8–25)
CO2 (EXT): 29 mmol/L (ref 23–32)
CalciumCalcium (EXT): 9.6 mg/dL (ref 8.5–10.5)
Chloride (EXT): 102 mmol/L (ref 98–108)
Creatinine (EXT): 0.56 mg/dL — ABNORMAL LOW (ref 0.60–1.50)
GFR Estimated (Calc) (EXT): >120 mL/min/{1.73_m2} (ref >59)
Glucose (EXT): 114 mg/dL — ABNORMAL HIGH (ref 70–110)
Potassium (EXT): 4.1 mmol/L (ref 3.4–5.0)
Sodium (EXT): 141 mmol/L (ref 135–145)

## 2017-04-22 LAB — BMP (EXT)
Anion Gap (EXT): 10 mmol/L (ref 3–17)
BUN (EXT): 12 mg/dL (ref 8–25)
CO2 (EXT): 26 mmol/L (ref 23–32)
CalciumCalcium (EXT): 9.2 mg/dL (ref 8.5–10.5)
Chloride (EXT): 103 mmol/L (ref 98–108)
Creatinine (EXT): 0.56 mg/dL — ABNORMAL LOW (ref 0.60–1.50)
GFR Estimated (Calc) (EXT): >120 mL/min/{1.73_m2} (ref >59)
Glucose (EXT): 104 mg/dL (ref 70–110)
Potassium (EXT): 4.3 mmol/L (ref 3.4–5.0)
Sodium (EXT): 139 mmol/L (ref 135–145)

## 2017-04-23 LAB — BMP (EXT)
Anion Gap (EXT): 11 mmol/L (ref 3–17)
BUN (EXT): 17 mg/dL (ref 8–25)
CO2 (EXT): 27 mmol/L (ref 23–32)
CalciumCalcium (EXT): 9.2 mg/dL (ref 8.5–10.5)
Chloride (EXT): 104 mmol/L (ref 98–108)
Creatinine (EXT): 0.52 mg/dL — ABNORMAL LOW (ref 0.60–1.50)
GFR Estimated (Calc) (EXT): >120 mL/min/{1.73_m2} (ref >59)
Glucose (EXT): 110 mg/dL (ref 70–110)
Potassium (EXT): 3.7 mmol/L (ref 3.4–5.0)
Sodium (EXT): 142 mmol/L (ref 135–145)

## 2017-04-25 ENCOUNTER — Ambulatory Visit

## 2017-04-25 NOTE — Progress Notes (Signed)
* * *        **  Angela [name]**    --- ---    42 Y old Female, [name]: 12-25-1990    [address] [place], [place], [name] [address]    Home: [phone]    Provider: Azucena Fallen        * * *    Telephone Encounter    ---    Answered by   Baruch Goldmann  Date: 04/25/2017         Time: 11:54 AM    Reason   Appt    --- ---            Message                      Patient's mother called and LM stating she was told she would see [name] this week and needs an appt.                Action Taken   Benoit,Heather 04/25/2017 11:55:16 AM > will leave for Megan-  there aren't appts? Benoit,Heather 04/25/2017 11:55:58 AM > Mother [place]  number is [phone] Maliea Grandmaison,[name] 04/25/2017 2:02:58 PM > ? put on  cancellation list? Benoit,Heather 04/25/2017 3:06:14 PM > called and LM  Benoit,Heather 04/25/2017 3:33:01 PM > appt booked                * * *                ---          * * *          Patient: Angela [name]: 1990/11/22 Provider: Azucena Fallen  04/25/2017    ---    Note generated by eClinicalWorks EMR/PM Software ([email or web address])

## 2017-04-26 ENCOUNTER — Ambulatory Visit

## 2017-04-26 ENCOUNTER — Ambulatory Visit: Admitting: Internal Medicine

## 2017-04-26 NOTE — Progress Notes (Signed)
* * *        **  [name]**    --- ---    68 Y old Female, DOB: 07-13-1991    [address], [place], [place] [address]    Home: [phone]    Provider: Jordan Likes        * * *    Telephone Encounter    ---    Answered by   Dwyane Dee  Date: 04/26/2017         Time: 03:10 PM    Caller   mother    --- ---            Reason   cxd appt            Message                      mom called states pt in inpatient at [facility] and did not want to reshedule.                    * * *                ---          * * *          Patient: Angela [name], Angela [name] DOB: 1991/03/23 Provider: Junious Dresser P  04/26/2017    ---    Note generated by eClinicalWorks EMR/PM Software ([email or web address])

## 2017-04-27 ENCOUNTER — Ambulatory Visit: Admitting: Internal Medicine

## 2017-04-27 NOTE — Progress Notes (Signed)
* * *        **  Angela [name]**    --- ---    2 Y old Female, DOB: [birth year]    [address] [place], [place], [name] [address]    Home: [phone]    Provider: Joesph July        * * *    Telephone Encounter    ---    Answered by   Cheral Marker  Date: 04/27/2017         Time: 12:17 PM    Caller   Willow Ora [phone]    --- ---            Reason   Lyrica refill            Message                      Pt. took last dose of Lyrica 25mg , 1 tab BID, Requesting refill and knows Pt. has appt. tomorrow w/ NP.                Action Taken   [facility] 04/27/2017 12:51:49 PM > [name], Wait to fill until  tomorrow at appt? [name] Comeiro 04/27/2017 02:26:18 PM>Okay to fill  today. #60, no refills. Thank you Valeras,Amy 04/27/2017 2:30:40 PM > Printed  for MD Casmir Auguste to sign-            Refills  Refill Lyrica Capsule, 25 MG, Orally, 60 Capsule, 1 capsule, Twice a  day, 30 days, Refills=0    --- ---          * * *                ---          * * *          Patient: Angela [name] DOB: 1990/11/11 Provider: Joesph July  04/27/2017    ---    Note generated by eClinicalWorks EMR/PM Software ([email or web address])

## 2017-04-28 ENCOUNTER — Ambulatory Visit

## 2017-04-28 ENCOUNTER — Ambulatory Visit (HOSPITAL_BASED_OUTPATIENT_CLINIC_OR_DEPARTMENT_OTHER)

## 2017-04-28 NOTE — Progress Notes (Signed)
* * *        **  Angela [name]**    --- ---    81 Y old Female, DOB: [birth year]    [address] [place], [place], [name] [address]    Home: [phone]    Provider: Azucena Fallen        * * *    Telephone Encounter    ---    Answered by   Cheral Marker  Date: 04/28/2017         Time: 02:53 PM    Caller   Ingrid-Mother [phone]    --- ---            Reason   Midodrine Rx.            Message                      f/u per discussion this morning Mother states she had w/ NP, Megan. Wether to increase dose??      Tried all day to get a hold of someone from neuroendocrinology dept. from [facility].      What are Megan's thoughts on this because she seems to be getting less results on this dose.                      Action Taken   [facility] 04/28/2017 2:57:26 PM > NP to review Ibraham Levi,Megan-  Rose 04/28/2017 3:44:48 PM > What is the dose? Valeras,Amy 04/28/2017 4:00:06 PM >  Midodrine 5mg  TID- Carsen Machi,[name] 04/28/2017 4:06:30 PM > Can try increasing  to 10 mg in AM and leave other two doses the same. _update on Monday  Valeras,Amy 04/28/2017 5:27:10 PM > Pt. mother notified Valeras,Amy 04/28/2017  5:29:02 PM > Pt. mother notified            Refills  Increase Midodrine HCl Tablet, 5 MG, Orally, 120 Tablet, 2 tablets,  qam, 1 tablet at noon & 1 in the evening=Total of 20mg  daily, 30 days,  Refills=0    --- ---          * * *                ---          * * *          Patient: Angela [name] DOB: 1990-12-17 Provider: Azucena Fallen  04/28/2017    ---    Note generated by eClinicalWorks EMR/PM Software ([email or web address])

## 2017-04-28 NOTE — Progress Notes (Signed)
* * *        **Angela [name]    --- ---    61 Y old Female, DOB: [birth year]    Account Number: [id number]    [address] [place], [place]    Home: [phone]    Guarantor: Angela [name] Insurance: [employer] Payer ID: [id number]    PCP: Howell Pringle    Appointment Facility: [facility]        * * *    04/28/2017  Progress Notes: [name] Dandrae Kustra, APGNP.    --- ---    ---         **Current Medications**    ---    Taking     * MetFORMIN HCl ER 500 mg Tablet Extended Release 24 Hour 1 tablet Orally twice a day (bid)    ---    * Duloxetine HCl 60 MG Capsule Delayed Release Particles 1 capsule Orally Once a day    ---    * NovoLog 100 UNIT/ML Solution Subcutaneous     ---    * Lantus 100 UNIT/ML Solution 25 units Subcutaneous at bedtime    ---    * OneTouch Verio Flex System w/Device Kit USE METER 3 TO 4 TIMES A DAY     ---    * Nitrofurantoin Monohyd Macro 100 MG Capsule 1 capsule with food Orally every 12 hrs    ---    * Advil 200 MG Tablet 1 tablet with food or milk as needed Orally Three times a day    ---    * Lyrica 25 MG Capsule 1 capsule Orally Twice a day    ---    * Midodrine HCl 5 MG Tablet 1 tablet Orally Three times a day    ---    * PredniSONE 1 MG Tablet 3 tablets with food or milk Orally Once a day    ---    * Medication List reviewed and reconciled with the patient    ---      Past Medical History    ---       Medical History Verified..        ---       **Surgical History**    ---       No Surgical History documented.    ---       **Family History**    ---       Father: alive, diagnosed with Hypertension    ---    Mother: alive, Hypertension    ---       **Allergies**    ---       Tetracycline HCl    ---    Neurontin: nausea - Side Effects    ---       **Hospitalization/Major Diagnostic Procedure**    ---       Pain, UTI-[facility] ED 03/2017    ---    Orthostatic hypotension-[facility] 03/2017    ---       **Review of Systems**    ---    All other systems were reviewed  and were negative except per the history of  present illness.         **Reason for Appointment**    ---       1\. Hospital follow-up    ---      **History of Present Illness**    ---     _*._ :    The patient presents today for a hospital  followup. The patient was  hospitalized at [facility] recently. There is no documentation  available for this visit from [facility].    The patient is accompanied today to today's visit by her mother, who is very  involved and supportive.    The patient and mother report that the patient went to be seen by a physician,  who specializes in fibromyalgia at [facility]. The physician was  concerned about Angela [name]'s clinical status and recommended that she be  evaluated at [facility]. The patient went to [facility]  and the main concern was the patient's blood pressures. They report that the  patient's blood pressure dropped as low as 47/32 at [facility]. They report  that the patient had a very extensive workup with diagnostic tests and  multiple specialty consults including Rheumatology and Endocrinology.    The patient states that the cause of the orthostasis was issues with her  adrenal glands. She was placed on midodrine three times a day. She is unsure  of the dose. She is also taking prednisone 3 mg per day.    They also [facility], who recommended starting CBD oil, which the patient finds somewhat  effective, but causes loose stools.    The patient will be following up with Rheumatology next week and also  Endocrinology. Endocrinology will also be managing the patient's diabetes. For  now, she does request a refill on Lantus and NovoLog.    The patient's Norco was decreased to twice a day with one as needed. The  patient was tolerating this fairly well until two days ago when the pain  seemed to increase also skin sensitivity. The patient reports that she wakes  up sweating  every time she falls asleep. Her feet are also very painful per  her report.    The patient is also being followed by Psychiatry and is attending mental  health therapy weekly.    The patient's mother requests that family medical leave paperwork be  completed, so she is going to accompany the patient to her multiple  appointments, which is reasonable.       **Vital Signs**    ---    Wt 128, Ht 68, BMI 19.46, Temp 97.0, HR 112, BP 100/70, RR 16, SaO2 99% (RA),  BP sitting 90/70, BP supine 70/40.       **Physical Examination**    ---     _GENERAL_ :    Appears stated as: yes. Build: normal . Eye contact: normal. General  Appearance: appears less uncomfortable and stiff. Hygiene: good. Ill-  appearance: none. Mental Status: alert and oriented. Mood/Affect: sad,  tearful, and anxious and anxious at times. Race: caucasion. Speech: clear.    _HEENT_ :    Eyes: non-icteric sclera, conjunctiva clear. Head: atraumatic, normocephalic.    _NECK_ :    Jugular venous distension: none. Neck supple.    _LUNGS_ :    Auscultation: CTA bilaterally. Effort: no respiratory distress.    _HEART_ :    Edema: trace LLE edema. Heart sounds: normal. Murmurs: none. Rate:  tachycardic-128. Rhythm: regular.    _NEUROLOGICAL_ :    Cranial Nerves: CN's II-XII grossly intact. Gait: slow.    _SKIN_ :    Color: good. General: warm, moist.    _PSYCHOLOGY_ :    Memory: good .          **Assessments**    ---  1\. Fibromyalgia - M79.7 (Primary)    ---    2\. Type 1 diabetes - E10.9    ---    3\. Orthostatic hypotension - I95.1    ---       **Treatment**    ---       **1\. Fibromyalgia**    Refill Hydrocodone-Acetaminophen Tablet, 10-325 MG, 1 tablet as needed,  Orally, three times a day (tid), 15 days, 45, Refills 0    Notes:      1. The plan is to eventually taper the patient off the hydrocodone when her pain is eventually well controlled. The patient will try to take two tablets as needed per day and not take it three times a day as she can.    .     ---        **2\. Type 1 diabetes**    Refill NovoLog Solution, 100 UNIT/ML, as directed per sliding scale,  Subcutaneous, four times a day (qid) as needed (prn), 30 days, 1, Refills 1    Refill Lantus Solution, 100 UNIT/ML, 25 units, Subcutaneous, at bedtime, 30  days, 1, Refills 1    Notes:    We will refill the patient's insulin. She will be then following with  Endocrinology.    .        **3\. Orthostatic hypotension**    Notes:      1. The patient was orthostatic today, but not symptomatic. Information about vital signs written down for the patient and her mother and they will call the prescriber of the midodrine and prednisone to see if they want to adjust these medications.    .        **4\. Others**    Notes:      1. Hospitalization within the last 30 days. We will complete the FMLA paperwork for the patient's mother and we will call when that is ready for her to pick up. She is aware and in agreement with this plan.    .    Electronically signed by Azucena Fallen , APGNP on 05/24/2017 at 04:56 PM  EDT    Sign off status: Completed        * * *        [facility]    [address]    [address]    [place], [place] [address]    Tel: [phone]    Fax: [phone]              * * *          Patient: Angela [name], Schleifer DOB: 08/08/1991 Progress Note: [name] Kieanna Rollo,  APGNP. 04/28/2017    ---    Note generated by eClinicalWorks EMR/PM Software ([email or web address])

## 2017-05-01 ENCOUNTER — Ambulatory Visit: Admitting: Internal Medicine

## 2017-05-01 NOTE — Progress Notes (Signed)
* * *        **  Angela [name]**    --- ---    44 Y old Female, DOB: 02/10/1991    [address] [place], [place], [name] [address]    Home: [phone]    Provider: Howell Pringle        * * *    Telephone Encounter    ---    Answered by   Cheral Marker  Date: 05/01/2017         Time: 04:59 PM    Reason   SIG change: Midodrine    --- ---            Action Taken   [facility] 05/01/2017 5:01:44 PM > Rx. has been changed in  eclinical per NP, Megan            Refills  Increase Midodrine HCl Tablet, 5 MG, Orally, 2 tablets, qam, 1 tablet  at noon & 1 in the evening=Total of 20mg  daily    --- ---          * * *                ---          * * *          Patient: Angela [name] DOB: 1991/07/21 Provider: Howell Pringle  05/01/2017    ---    Note generated by eClinicalWorks EMR/PM Software ([email or web address])

## 2017-05-02 ENCOUNTER — Ambulatory Visit

## 2017-05-02 NOTE — Progress Notes (Signed)
* * *        **  Angela [name]**    --- ---    51 Y old Female, DOB: May 01, 1991    [address] [place], [place], [name] [address]    Home: [phone]    Provider: Azucena Fallen        * * *    Telephone Encounter    ---    Answered by   Azucena Fallen  Date: 05/02/2017         Time: 02:33 PM    Reason   FMLA forms    --- ---            Message                      FMLA forms completed per patient/patient's mother's request                 Action Taken   Damaso Laday,[name] 05/02/2017 2:33:52 PM > To Jennie B.  Bugler,Jennifer 05/02/2017 3:21:09 PM > Left message form is ready to be picked  up or if she needs it faxed to call us. Bugler,Jennifer 05/04/2017 11:39:48 AM  > Form was picked up yesterday.                * * *                ---          * * *          Patient: Angela, [name] DOB: 1991/01/31 Provider: Azucena Fallen  05/02/2017    ---    Note generated by eClinicalWorks EMR/PM Software ([email or web address])

## 2017-05-11 ENCOUNTER — Ambulatory Visit

## 2017-05-22 ENCOUNTER — Ambulatory Visit

## 2018-01-12 LAB — CMP (EXT)
ALT/SGPT (EXT): 17 U/L (ref 7–33)
AST/SGOT (EXT): 27 U/L (ref 9–32)
Albumin (EXT): 4.8 g/dL (ref 3.3–5.0)
Alkaline Phosphatase (EXT): 83 U/L (ref 30–100)
Anion Gap (EXT): 12 mmol/L (ref 3–17)
BUN (EXT): 23 mg/dL (ref 8–25)
Bilirubin, Total (EXT): 0.4 mg/dL (ref 0.0–1.0)
CO2 (EXT): 27 mmol/L (ref 23–32)
CalciumCalcium (EXT): 9.6 mg/dL (ref 8.5–10.5)
Chloride (EXT): 102 mmol/L (ref 98–108)
Creatinine (EXT): 0.77 mg/dL (ref 0.60–1.50)
GFR Estimated (Calc) (EXT): 107 mL/min/{1.73_m2} (ref >59)
Globulin (EXT): 3.3 g/dL (ref 1.9–4.1)
Glucose (EXT): 73 mg/dL (ref 70–110)
Potassium (EXT): 4.2 mmol/L (ref 3.4–5.0)
Protein (EXT): 8.1 g/dL (ref 6.0–8.3)
Sodium (EXT): 141 mmol/L (ref 135–145)

## 2018-01-12 LAB — MICROALBUMIN URINE (EXT)
Albumin, urine (INT/EXT): 1.4 mg/dL (ref 0.0–2.0)
Creatinine, urine, random (INT/EXT): 285 mg/dL
Microalbumin/Creatinine Ratio Urine (EXT): 4.9 mg/g{creat} (ref <30.0)

## 2018-01-12 LAB — LIPID PROFILE (EXT)
Cholesterol (EXT): 179 mg/dL
HDL Cholesterol (EXT): 41 mg/dL (ref 35–100)
LDL Cholesterol (EXT): 126 mg/dL (ref 50–129)
NON HDL Cholesterol (EXT): 138 mg/dL
Risk Factor (EXT): 4.4 (ref 0.0–5.0)
Triglycerides (EXT): 58 mg/dL (ref 40–150)

## 2018-01-12 LAB — HEMOGLOBIN A1C
Estimated Average Glucose mg/dL (INT/EXT): 148 mg/dL
HEMOGLOBIN A1C % (INT/EXT): 6.8 % — ABNORMAL HIGH (ref 4.3–6.4)

## 2019-03-23 ENCOUNTER — Inpatient Hospital Stay
Admit: 2019-03-23 | Discharge: 2019-03-23 | Disposition: A | Discharge status: Home / Self Care | Payer: PRIVATE HEALTH INSURANCE | Attending: Emergency Medicine

## 2019-03-23 DIAGNOSIS — R112 Nausea with vomiting, unspecified: Secondary | ICD-10-CM

## 2019-03-23 LAB — CBC WITH AUTO DIFFERENTIAL
Basophils %: 0 % (ref 0–1.2)
Basophils Absolute: 0 10*3/uL (ref 0.0–0.1)
Eosinophils %: 0 %
Eosinophils Absolute: 0 10*3/uL (ref 0.0–0.5)
Granulocyte Absolute Count: 0.1 10*3/uL — ABNORMAL HIGH (ref 0–0.03)
Hematocrit: 41.5 % (ref 36.0–45.0)
Hemoglobin: 14.5 g/dL (ref 11.8–14.8)
Immature Granulocytes: 0 % (ref 0–0.4)
Lymphocytes %: 11 %
Lymphocytes Absolute: 2 10*3/uL (ref 1.2–3.7)
MCH: 32.2 PG (ref 25.6–32.2)
MCHC: 34.9 g/dL (ref 32.2–35.5)
MCV: 92 FL (ref 80.0–95.0)
MPV: 9.9 FL (ref 9.4–12.4)
Monocytes %: 8 %
Monocytes Absolute: 1.4 10*3/uL — ABNORMAL HIGH (ref 0.2–0.8)
Neutrophils %: 81 %
Neutrophils Absolute: 14.7 10*3/uL — ABNORMAL HIGH (ref 1.6–6.1)
Platelets: 332 10*3/uL (ref 150–400)
RBC: 4.51 M/uL (ref 3.93–5.22)
RDW: 12.5 % (ref 11.6–14.4)
WBC: 18.2 10*3/uL — ABNORMAL HIGH (ref 4.0–10.0)

## 2019-03-23 LAB — COMPREHENSIVE METABOLIC PANEL
ALT: 54 U/L (ref 13–56)
AST: 38 U/L — ABNORMAL HIGH (ref 15–37)
Albumin/Globulin Ratio: 0.9 — ABNORMAL LOW (ref 1.0–3.0)
Albumin: 4.3 g/dL (ref 3.4–5.0)
Alkaline Phosphatase: 62 U/L (ref 45–117)
Anion Gap: 15 mmol/L (ref 5–15)
BUN: 42 MG/DL — ABNORMAL HIGH (ref 7–18)
Bun/Cre Ratio: 26 NA — ABNORMAL HIGH (ref 12–20)
CO2: 26 mmol/L (ref 21–32)
Calcium: 8 MG/DL — ABNORMAL LOW (ref 8.5–10.1)
Chloride: 97 mmol/L — ABNORMAL LOW (ref 98–110)
Creatinine: 1.62 MG/DL — ABNORMAL HIGH (ref 0.55–1.02)
EGFR IF NonAfrican American: 41 mL/min/{1.73_m2} — ABNORMAL LOW (ref >60)
GFR African American: 46 mL/min/{1.73_m2} — ABNORMAL LOW (ref >60)
Glucose: 206 mg/dL — ABNORMAL HIGH (ref 70–100)
Potassium: 3.9 mmol/L (ref 3.5–5.1)
Sodium: 138 mmol/L (ref 136–145)
Total Bilirubin: 0.7 mg/dL (ref 0.20–1.20)
Total Protein: 8.9 g/dL — ABNORMAL HIGH (ref 6.4–8.2)

## 2019-03-23 LAB — HCG QL SERUM
HCG, Ql.: NEGATIVE
HCG, Ql.: NEGATIVE

## 2019-03-23 LAB — LIPASE
Lipase: 23 U/L — ABNORMAL LOW (ref 73–393)
Lipase: 23 U/L — ABNORMAL LOW (ref 73–393)

## 2019-03-23 LAB — METABOLIC PANEL, COMPREHENSIVE
A-G Ratio: 0.9 — ABNORMAL LOW (ref 1.0–3.0)
ALT (SGPT): 54 U/L (ref 13–56)
AST (SGOT): 38 U/L — ABNORMAL HIGH (ref 15–37)
Albumin: 4.3 g/dL (ref 3.4–5.0)
Alk. phosphatase: 62 U/L (ref 45–117)
Anion gap: 15 mmol/L (ref 5–15)
BUN/Creatinine ratio: 26 — ABNORMAL HIGH (ref 12–20)
BUN: 42 MG/DL — ABNORMAL HIGH (ref 7–18)
Bilirubin, total: 0.7 mg/dL (ref 0.20–1.20)
CO2: 26 mmol/L (ref 21–32)
Calcium: 8 MG/DL — ABNORMAL LOW (ref 8.5–10.1)
Chloride: 97 mmol/L — ABNORMAL LOW (ref 98–110)
Creatinine: 1.62 MG/DL — ABNORMAL HIGH (ref 0.55–1.02)
GFR est AA: 46 mL/min/{1.73_m2} — ABNORMAL LOW (ref >60)
GFR est non-AA: 41 mL/min/{1.73_m2} — ABNORMAL LOW (ref >60)
Glucose: 206 mg/dL — ABNORMAL HIGH (ref 70–100)
Potassium: 3.9 mmol/L (ref 3.5–5.1)
Protein, total: 8.9 g/dL — ABNORMAL HIGH (ref 6.4–8.2)
Sodium: 138 mmol/L (ref 136–145)

## 2019-03-23 LAB — CBC WITH AUTOMATED DIFF
ABS. BASOPHILS: 0 10*3/uL (ref 0.0–0.1)
ABS. EOSINOPHILS: 0 10*3/uL (ref 0.0–0.5)
ABS. IMM. GRANS.: 0.1 10*3/uL — ABNORMAL HIGH (ref 0–0.03)
ABS. LYMPHOCYTES: 2 10*3/uL (ref 1.2–3.7)
ABS. MONOCYTES: 1.4 10*3/uL — ABNORMAL HIGH (ref 0.2–0.8)
ABS. NEUTROPHILS: 14.7 10*3/uL — ABNORMAL HIGH (ref 1.6–6.1)
BASOPHILS: 0 % (ref 0–1.2)
EOSINOPHILS: 0 %
HCT: 41.5 % (ref 36.0–45.0)
HGB: 14.5 g/dL (ref 11.8–14.8)
IMMATURE GRANULOCYTES: 0 % (ref 0–0.4)
LYMPHOCYTES: 11 %
MCH: 32.2 PG (ref 25.6–32.2)
MCHC: 34.9 g/dL (ref 32.2–35.5)
MCV: 92 FL (ref 80.0–95.0)
MONOCYTES: 8 %
MPV: 9.9 FL (ref 9.4–12.4)
NEUTROPHILS: 81 %
PLATELET: 332 10*3/uL (ref 150–400)
RBC: 4.51 M/uL (ref 3.93–5.22)
RDW: 12.5 % (ref 11.6–14.4)
WBC: 18.2 10*3/uL — ABNORMAL HIGH (ref 4.0–10.0)

## 2019-03-23 MED ORDER — ONDANSETRON (PF) 4 MG/2 ML INJECTION
4 mg/2 mL | INTRAMUSCULAR | Status: AC
Start: 2019-03-23 — End: 2019-03-23
  Administered 2019-03-23: 08:00:00 via INTRAVENOUS

## 2019-03-23 MED ORDER — ONDANSETRON HCL 4 MG TAB
4 mg | ORAL_TABLET | Freq: Three times a day (TID) | ORAL | 0 refills | Status: DC | PRN
Start: 2019-03-23 — End: 2019-03-25

## 2019-03-23 MED ORDER — ONDANSETRON (PF) 4 MG/2 ML INJECTION
4 mg/2 mL | INTRAMUSCULAR | Status: AC
Start: 2019-03-23 — End: 2019-03-23
  Administered 2019-03-23: 07:00:00 via INTRAVENOUS

## 2019-03-23 MED ORDER — KETOROLAC TROMETHAMINE 30 MG/ML INJECTION
30 mg/mL (1 mL) | INTRAMUSCULAR | Status: AC
Start: 2019-03-23 — End: 2019-03-23
  Administered 2019-03-23: 07:00:00 via INTRAVENOUS

## 2019-03-23 MED ORDER — SODIUM CHLORIDE 0.9% BOLUS IV
0.9 % | Freq: Once | INTRAVENOUS | Status: AC
Start: 2019-03-23 — End: 2019-03-23
  Administered 2019-03-23: 07:00:00 via INTRAVENOUS

## 2019-03-23 MED ORDER — SODIUM CHLORIDE 0.9% BOLUS IV
0.9 % | Freq: Once | INTRAVENOUS | Status: AC
Start: 2019-03-23 — End: 2019-03-23
  Administered 2019-03-23: 08:00:00 via INTRAVENOUS

## 2019-03-23 MED FILL — KETOROLAC TROMETHAMINE 30 MG/ML INJECTION: 30 mg/mL (1 mL) | INTRAMUSCULAR | Qty: 1

## 2019-03-23 MED FILL — ONDANSETRON (PF) 4 MG/2 ML INJECTION: 4 mg/2 mL | INTRAMUSCULAR | Qty: 2

## 2019-03-23 MED FILL — SODIUM CHLORIDE 0.9 % IV: INTRAVENOUS | Qty: 1000

## 2019-03-23 NOTE — ED Provider Notes (Signed)
28 yr old female with IDDM and fibromyalgia, presents to the ED with n/v/d that started a little over 24 hours ago. Pt reports some abd discomfort, but mostly just feeling very nauseated. She reports non bloody, watery emesis and diarrhea. She has been unable to hold anything down. She has not taken any medications for this. She has not taken her insulin because she is not eating. LMP was 1 week ago. She denies any dysuria, no vaginal symptoms. She denies any sick contacts. No fevers or other associated symptoms. She has had food poisoning in the past, but it has never lasted this long.            No past medical history on file.    No past surgical history on file.      No family history on file.    Social History     Socioeconomic History   ??? Marital status: SINGLE     Spouse name: Not on file   ??? Number of children: Not on file   ??? Years of education: Not on file   ??? Highest education level: Not on file   Occupational History   ??? Not on file   Social Needs   ??? Financial resource strain: Not on file   ??? Food insecurity     Worry: Not on file     Inability: Not on file   ??? Transportation needs     Medical: Not on file     Non-medical: Not on file   Tobacco Use   ??? Smoking status: Not on file   Substance and Sexual Activity   ??? Alcohol use: Not on file   ??? Drug use: Not on file   ??? Sexual activity: Not on file   Lifestyle   ??? Physical activity     Days per week: Not on file     Minutes per session: Not on file   ??? Stress: Not on file   Relationships   ??? Social Wellsite geologistconnections     Talks on phone: Not on file     Gets together: Not on file     Attends religious service: Not on file     Active member of club or organization: Not on file     Attends meetings of clubs or organizations: Not on file     Relationship status: Not on file   ??? Intimate partner violence     Fear of current or ex partner: Not on file     Emotionally abused: Not on file     Physically abused: Not on file     Forced sexual activity: Not on file    Other Topics Concern   ??? Not on file   Social History Narrative   ??? Not on file         ALLERGIES: Patient has no known allergies.    Review of Systems   Constitutional: Positive for activity change, appetite change, chills and fatigue. Negative for diaphoresis, fever and unexpected weight change.   HENT: Negative for congestion, rhinorrhea, sinus pressure, sneezing, sore throat and trouble swallowing.    Eyes: Negative for photophobia and visual disturbance.   Respiratory: Negative for cough, chest tightness and shortness of breath.    Cardiovascular: Negative for chest pain, palpitations and leg swelling.   Gastrointestinal: Positive for abdominal pain, diarrhea and vomiting. Negative for blood in stool, constipation and nausea.   Genitourinary: Negative for difficulty urinating, dysuria, flank pain, frequency and hematuria.  Musculoskeletal: Negative for back pain, joint swelling, neck pain and neck stiffness.   Skin: Negative for rash and wound.   Allergic/Immunologic: Negative.    Neurological: Positive for weakness. Negative for dizziness, syncope, light-headedness, numbness and headaches.   Hematological: Negative.    Psychiatric/Behavioral: Positive for sleep disturbance. Negative for confusion and suicidal ideas.       Vitals:    03/23/19 0218   BP: 140/88   Pulse: (!) 106   Resp: 18   Temp: 98 ??F (36.7 ??C)   SpO2: 97%   Weight: 74.8 kg (165 lb)   Height: 5' 8" (1.727 m)            Physical Exam  Vitals signs reviewed.   Constitutional:       General: She is not in acute distress.     Appearance: She is well-developed. She is ill-appearing. She is not toxic-appearing or diaphoretic.   HENT:      Head: Atraumatic.      Right Ear: External ear normal.      Left Ear: External ear normal.      Mouth/Throat:      Mouth: Mucous membranes are moist.      Pharynx: No oropharyngeal exudate.   Eyes:      Conjunctiva/sclera: Conjunctivae normal.      Pupils: Pupils are equal, round, and reactive to light.   Neck:       Musculoskeletal: Normal range of motion and neck supple.   Cardiovascular:      Rate and Rhythm: Normal rate and regular rhythm.      Pulses: Normal pulses.      Heart sounds: Normal heart sounds. No murmur.   Pulmonary:      Effort: Pulmonary effort is normal. No respiratory distress.      Breath sounds: Normal breath sounds. No wheezing or rales.   Abdominal:      General: Bowel sounds are normal. There is no distension.      Palpations: Abdomen is soft. There is no mass.      Tenderness: There is no abdominal tenderness. There is no guarding or rebound.   Musculoskeletal: Normal range of motion.         General: No tenderness or deformity.      Right lower leg: No edema.      Left lower leg: No edema.   Lymphadenopathy:      Cervical: No cervical adenopathy.   Skin:     General: Skin is warm and dry.      Findings: No erythema or rash.   Neurological:      General: No focal deficit present.      Mental Status: She is alert and oriented to person, place, and time.      Cranial Nerves: No cranial nerve deficit.      Coordination: Coordination normal.      Deep Tendon Reflexes: Reflexes normal.   Psychiatric:         Behavior: Behavior normal.         Thought Content: Thought content normal.         Judgment: Judgment normal.          MDM  Number of Diagnoses or Management Options  Diagnosis management comments: 28 yr old female with IDDM presents to the ED with n/v/d that started just over 24 hours ago. Pt is afebrile, ill but non toxic in appearance with mild tachycardia, otherwise nl vital signs. She is alert with no   AMS. Pt has no sig discomfort to abdominal palpation with no  peritoneal signs. Physical exam is otherwise unremarkable. She has a WBC of 18.2. this may be a stress response. Cr is 1.62 with a BUN of 42 indicating dehydration. Nl electrolytes, no anion gap. No indication of DKA.  Nl hepatic function.  IV access  obtained and pt given 1L NS. pt given zofran and toradol for symptomatic management.      On repeat exam, pt feels much improved. She is tolerating po well and is requesting to be discharged home. She continues to be afebrile, with nl vital signs and no peritoneal signs. No indication of trauma, serious bacterial infection, dehydration or an acute abdomen. Based on this, I feel pt is safe to be discharged home with follow up from PCP. Discussed all findings with pt and family who feel reassured, in agreement with plan and ready for discharge. Pt discharged home in good condition. All indications for emergent re-evaluation and  possible medication side effects discussed with pt prior to discharge and she expressed  understanding..       Amount and/or Complexity of Data Reviewed  Clinical lab tests: reviewed           Procedures      NIH Stroke Scale

## 2019-03-23 NOTE — ED Notes (Signed)
I have reviewed discharge instructions with the patient.  The patient verbalized understanding. IV removed and all questions answered. Pt ambulatory out to waiting room for ride home with parents.

## 2019-03-23 NOTE — ED Triage Notes (Signed)
Pt here with mom c/o N/V/D since midnight on Thursday. Mom states she thinks she ate something bad. Pt reports smoking weed, last use 3 days ago.

## 2019-03-23 NOTE — ED Notes (Signed)
I have reviewed discharge instructions with the patient.  The patient verbalized understanding. IV removed and all questions answered. Pt ambulatory out to waiting room for ride home with parents.

## 2019-03-23 NOTE — ED Notes (Signed)
Pt here with mom c/o N/V/D since midnight on Thursday. Mom states she thinks she ate something bad. Pt reports smoking weed, last use 3 days ago.

## 2019-03-23 NOTE — ED Provider Notes (Signed)
28 yr old female with IDDM and fibromyalgia, presents to the ED with n/v/d that started a little over 24 hours ago. Pt reports some abd discomfort, but mostly just feeling very nauseated. She reports non bloody, watery emesis and diarrhea. She has been unable to hold anything down. She has not taken any medications for this. She has not taken her insulin because she is not eating. LMP was 1 week ago. She denies any dysuria, no vaginal symptoms. She denies any sick contacts. No fevers or other associated symptoms. She has had food poisoning in the past, but it has never lasted this long.            No past medical history on file.    No past surgical history on file.      No family history on file.    Social History     Socioeconomic History   ??? Marital status: SINGLE     Spouse name: Not on file   ??? Number of children: Not on file   ??? Years of education: Not on file   ??? Highest education level: Not on file   Occupational History   ??? Not on file   Social Needs   ??? Financial resource strain: Not on file   ??? Food insecurity     Worry: Not on file     Inability: Not on file   ??? Transportation needs     Medical: Not on file     Non-medical: Not on file   Tobacco Use   ??? Smoking status: Not on file   Substance and Sexual Activity   ??? Alcohol use: Not on file   ??? Drug use: Not on file   ??? Sexual activity: Not on file   Lifestyle   ??? Physical activity     Days per week: Not on file     Minutes per session: Not on file   ??? Stress: Not on file   Relationships   ??? Social Wellsite geologistconnections     Talks on phone: Not on file     Gets together: Not on file     Attends religious service: Not on file     Active member of club or organization: Not on file     Attends meetings of clubs or organizations: Not on file     Relationship status: Not on file   ??? Intimate partner violence     Fear of current or ex partner: Not on file     Emotionally abused: Not on file     Physically abused: Not on file     Forced sexual activity: Not on file    Other Topics Concern   ??? Not on file   Social History Narrative   ??? Not on file         ALLERGIES: Patient has no known allergies.    Review of Systems   Constitutional: Positive for activity change, appetite change, chills and fatigue. Negative for diaphoresis, fever and unexpected weight change.   HENT: Negative for congestion, rhinorrhea, sinus pressure, sneezing, sore throat and trouble swallowing.    Eyes: Negative for photophobia and visual disturbance.   Respiratory: Negative for cough, chest tightness and shortness of breath.    Cardiovascular: Negative for chest pain, palpitations and leg swelling.   Gastrointestinal: Positive for abdominal pain, diarrhea and vomiting. Negative for blood in stool, constipation and nausea.   Genitourinary: Negative for difficulty urinating, dysuria, flank pain, frequency and hematuria.  Musculoskeletal: Negative for back pain, joint swelling, neck pain and neck stiffness.   Skin: Negative for rash and wound.   Allergic/Immunologic: Negative.    Neurological: Positive for weakness. Negative for dizziness, syncope, light-headedness, numbness and headaches.   Hematological: Negative.    Psychiatric/Behavioral: Positive for sleep disturbance. Negative for confusion and suicidal ideas.       Vitals:    03/23/19 0218   BP: 140/88   Pulse: (!) 106   Resp: 18   Temp: 98 ??F (36.7 ??C)   SpO2: 97%   Weight: 74.8 kg (165 lb)   Height: 5\' 8"  (1.727 m)            Physical Exam  Vitals signs reviewed.   Constitutional:       General: She is not in acute distress.     Appearance: She is well-developed. She is ill-appearing. She is not toxic-appearing or diaphoretic.   HENT:      Head: Atraumatic.      Right Ear: External ear normal.      Left Ear: External ear normal.      Mouth/Throat:      Mouth: Mucous membranes are moist.      Pharynx: No oropharyngeal exudate.   Eyes:      Conjunctiva/sclera: Conjunctivae normal.      Pupils: Pupils are equal, round, and reactive to light.   Neck:       Musculoskeletal: Normal range of motion and neck supple.   Cardiovascular:      Rate and Rhythm: Normal rate and regular rhythm.      Pulses: Normal pulses.      Heart sounds: Normal heart sounds. No murmur.   Pulmonary:      Effort: Pulmonary effort is normal. No respiratory distress.      Breath sounds: Normal breath sounds. No wheezing or rales.   Abdominal:      General: Bowel sounds are normal. There is no distension.      Palpations: Abdomen is soft. There is no mass.      Tenderness: There is no abdominal tenderness. There is no guarding or rebound.   Musculoskeletal: Normal range of motion.         General: No tenderness or deformity.      Right lower leg: No edema.      Left lower leg: No edema.   Lymphadenopathy:      Cervical: No cervical adenopathy.   Skin:     General: Skin is warm and dry.      Findings: No erythema or rash.   Neurological:      General: No focal deficit present.      Mental Status: She is alert and oriented to person, place, and time.      Cranial Nerves: No cranial nerve deficit.      Coordination: Coordination normal.      Deep Tendon Reflexes: Reflexes normal.   Psychiatric:         Behavior: Behavior normal.         Thought Content: Thought content normal.         Judgment: Judgment normal.          MDM  Number of Diagnoses or Management Options  Diagnosis management comments: 28 yr old female with IDDM presents to the ED with n/v/d that started just over 24 hours ago. Pt is afebrile, ill but non toxic in appearance with mild tachycardia, otherwise nl vital signs. She is alert with no  AMS. Pt has no sig discomfort to abdominal palpation with no  peritoneal signs. Physical exam is otherwise unremarkable. She has a WBC of 18.2. this may be a stress response. Cr is 1.62 with a BUN of 42 indicating dehydration. Nl electrolytes, no anion gap. No indication of DKA.  Nl hepatic function.  IV access  obtained and pt given 1L NS. pt given zofran and toradol for symptomatic management.      On repeat exam, pt feels much improved. She is tolerating po well and is requesting to be discharged home. She continues to be afebrile, with nl vital signs and no peritoneal signs. No indication of trauma, serious bacterial infection, dehydration or an acute abdomen. Based on this, I feel pt is safe to be discharged home with follow up from PCP. Discussed all findings with pt and family who feel reassured, in agreement with plan and ready for discharge. Pt discharged home in good condition. All indications for emergent re-evaluation and  possible medication side effects discussed with pt prior to discharge and she expressed  understanding..       Amount and/or Complexity of Data Reviewed  Clinical lab tests: reviewed           Procedures      NIH Stroke Scale

## 2019-03-24 ENCOUNTER — Emergency Department
Admit: 2019-03-24 | Payer: PRIVATE HEALTH INSURANCE | Primary: Student in an Organized Health Care Education/Training Program

## 2019-03-24 ENCOUNTER — Inpatient Hospital Stay
Admit: 2019-03-24 | Discharge: 2019-03-24 | Disposition: A | Discharge status: Home / Self Care | Payer: PRIVATE HEALTH INSURANCE | Attending: Emergency Medicine

## 2019-03-24 DIAGNOSIS — R112 Nausea with vomiting, unspecified: Secondary | ICD-10-CM

## 2019-03-24 LAB — CBC WITH AUTO DIFFERENTIAL
Basophils %: 1 % (ref 0–1.2)
Basophils Absolute: 0.1 10*3/uL (ref 0.0–0.1)
Eosinophils %: 2 %
Eosinophils Absolute: 0.3 10*3/uL (ref 0.0–0.5)
Granulocyte Absolute Count: 0 10*3/uL (ref 0.00–0.03)
Hematocrit: 37.4 % (ref 36.0–45.0)
Hemoglobin: 12.8 g/dL (ref 11.8–14.8)
Immature Granulocytes: 0 % (ref 0.0–0.4)
Lymphocytes %: 26 %
Lymphocytes Absolute: 3.4 10*3/uL (ref 1.2–3.7)
MCH: 31.8 PG (ref 25.6–32.2)
MCHC: 34.2 g/dL (ref 32.2–35.5)
MCV: 93 FL (ref 80.0–95.0)
MPV: 9.8 FL (ref 9.4–12.4)
Monocytes %: 8 %
Monocytes Absolute: 1.1 10*3/uL — ABNORMAL HIGH (ref 0.2–0.8)
Neutrophils %: 63 %
Neutrophils Absolute: 8.4 10*3/uL — ABNORMAL HIGH (ref 1.6–6.1)
Platelets: 256 10*3/uL (ref 150–400)
RBC: 4.02 M/uL (ref 3.93–5.22)
RDW: 12.3 % (ref 11.6–14.4)
WBC: 13.3 10*3/uL — ABNORMAL HIGH (ref 4.0–10.0)

## 2019-03-24 LAB — COMPREHENSIVE METABOLIC PANEL
ALT: 38 U/L (ref 13–56)
AST: 33 U/L (ref 15–37)
Albumin/Globulin Ratio: 0.9 — ABNORMAL LOW (ref 1.0–3.0)
Albumin: 3.6 g/dL (ref 3.4–5.0)
Alkaline Phosphatase: 55 U/L (ref 45–117)
Anion Gap: 7 mmol/L (ref 5–15)
BUN: 41 MG/DL — ABNORMAL HIGH (ref 7–18)
Bun/Cre Ratio: 21 NA — ABNORMAL HIGH (ref 12–20)
CO2: 29 mmol/L (ref 21–32)
Calcium: 7.4 MG/DL — ABNORMAL LOW (ref 8.5–10.1)
Chloride: 101 mmol/L (ref 98–110)
Creatinine: 1.94 MG/DL — ABNORMAL HIGH (ref 0.55–1.02)
EGFR IF NonAfrican American: 33 mL/min/{1.73_m2} — ABNORMAL LOW (ref >60)
GFR African American: 38 mL/min/{1.73_m2} — ABNORMAL LOW (ref >60)
Glucose: 128 mg/dL — ABNORMAL HIGH (ref 70–100)
Potassium: 3.5 mmol/L (ref 3.5–5.1)
Sodium: 137 mmol/L (ref 136–145)
Total Bilirubin: 0.7 mg/dL (ref 0.20–1.20)
Total Protein: 7.6 g/dL (ref 6.4–8.2)

## 2019-03-24 LAB — POCT GLUCOSE: POC Glucose: 125 mg/dL — ABNORMAL HIGH (ref 70–105)

## 2019-03-24 LAB — LIPASE
Lipase: 49 U/L — ABNORMAL LOW (ref 73–393)
Lipase: 49 U/L — ABNORMAL LOW (ref 73–393)

## 2019-03-24 LAB — CBC WITH AUTOMATED DIFF
ABS. BASOPHILS: 0.1 10*3/uL (ref 0.0–0.1)
ABS. EOSINOPHILS: 0.3 10*3/uL (ref 0.0–0.5)
ABS. IMM. GRANS.: 0 10*3/uL (ref 0.00–0.03)
ABS. LYMPHOCYTES: 3.4 10*3/uL (ref 1.2–3.7)
ABS. MONOCYTES: 1.1 10*3/uL — ABNORMAL HIGH (ref 0.2–0.8)
ABS. NEUTROPHILS: 8.4 10*3/uL — ABNORMAL HIGH (ref 1.6–6.1)
BASOPHILS: 1 % (ref 0–1.2)
EOSINOPHILS: 2 %
HCT: 37.4 % (ref 36.0–45.0)
HGB: 12.8 g/dL (ref 11.8–14.8)
IMMATURE GRANULOCYTES: 0 % (ref 0.0–0.4)
LYMPHOCYTES: 26 %
MCH: 31.8 PG (ref 25.6–32.2)
MCHC: 34.2 g/dL (ref 32.2–35.5)
MCV: 93 FL (ref 80.0–95.0)
MONOCYTES: 8 %
MPV: 9.8 FL (ref 9.4–12.4)
NEUTROPHILS: 63 %
PLATELET: 256 10*3/uL (ref 150–400)
RBC: 4.02 M/uL (ref 3.93–5.22)
RDW: 12.3 % (ref 11.6–14.4)
WBC: 13.3 10*3/uL — ABNORMAL HIGH (ref 4.0–10.0)

## 2019-03-24 LAB — METABOLIC PANEL, COMPREHENSIVE
A-G Ratio: 0.9 — ABNORMAL LOW (ref 1.0–3.0)
ALT (SGPT): 38 U/L (ref 13–56)
AST (SGOT): 33 U/L (ref 15–37)
Albumin: 3.6 g/dL (ref 3.4–5.0)
Alk. phosphatase: 55 U/L (ref 45–117)
Anion gap: 7 mmol/L (ref 5–15)
BUN/Creatinine ratio: 21 — ABNORMAL HIGH (ref 12–20)
BUN: 41 MG/DL — ABNORMAL HIGH (ref 7–18)
Bilirubin, total: 0.7 mg/dL (ref 0.20–1.20)
CO2: 29 mmol/L (ref 21–32)
Calcium: 7.4 MG/DL — ABNORMAL LOW (ref 8.5–10.1)
Chloride: 101 mmol/L (ref 98–110)
Creatinine: 1.94 MG/DL — ABNORMAL HIGH (ref 0.55–1.02)
GFR est AA: 38 mL/min/{1.73_m2} — ABNORMAL LOW (ref >60)
GFR est non-AA: 33 mL/min/{1.73_m2} — ABNORMAL LOW (ref >60)
Glucose: 128 mg/dL — ABNORMAL HIGH (ref 70–100)
Potassium: 3.5 mmol/L (ref 3.5–5.1)
Protein, total: 7.6 g/dL (ref 6.4–8.2)
Sodium: 137 mmol/L (ref 136–145)

## 2019-03-24 LAB — GLUCOSE, POC: Glucose (POC): 125 mg/dL — ABNORMAL HIGH (ref 70–105)

## 2019-03-24 MED ORDER — SODIUM CHLORIDE 0.9% BOLUS IV
0.9 % | Freq: Once | INTRAVENOUS | Status: AC
Start: 2019-03-24 — End: 2019-03-24
  Administered 2019-03-24: 09:00:00 via INTRAVENOUS

## 2019-03-24 MED ORDER — SODIUM CHLORIDE 0.9 % IJ SYRG
INTRAMUSCULAR | Status: DC | PRN
Start: 2019-03-24 — End: 2019-03-24

## 2019-03-24 MED ORDER — SODIUM CHLORIDE 0.9 % INJECTION
20 mg/2 mL | INTRAMUSCULAR | Status: AC
Start: 2019-03-24 — End: 2019-03-24
  Administered 2019-03-24: 09:00:00 via INTRAVENOUS

## 2019-03-24 MED ORDER — KETOROLAC TROMETHAMINE 30 MG/ML INJECTION
30 mg/mL (1 mL) | INTRAMUSCULAR | Status: AC
Start: 2019-03-24 — End: 2019-03-24
  Administered 2019-03-24: 09:00:00 via INTRAVENOUS

## 2019-03-24 MED ORDER — PROMETHAZINE 25 MG TAB
25 mg | ORAL_TABLET | Freq: Four times a day (QID) | ORAL | 0 refills | Status: DC | PRN
Start: 2019-03-24 — End: 2021-01-14

## 2019-03-24 MED ORDER — SODIUM CHLORIDE 0.9 % IJ SYRG
Freq: Three times a day (TID) | INTRAMUSCULAR | Status: DC
Start: 2019-03-24 — End: 2019-03-24

## 2019-03-24 MED ORDER — PROMETHAZINE 25 MG/ML INJECTION
25 mg/mL | INTRAMUSCULAR | Status: AC
Start: 2019-03-24 — End: 2019-03-24
  Administered 2019-03-24: 09:00:00 via INTRAVENOUS

## 2019-03-24 MED FILL — PROMETHAZINE 25 MG/ML INJECTION: 25 mg/mL | INTRAMUSCULAR | Qty: 1

## 2019-03-24 MED FILL — SODIUM CHLORIDE 0.9 % IV: INTRAVENOUS | Qty: 1000

## 2019-03-24 MED FILL — FAMOTIDINE (PF) 20 MG/2 ML IV: 20 mg/2 mL | INTRAVENOUS | Qty: 2

## 2019-03-24 MED FILL — KETOROLAC TROMETHAMINE 30 MG/ML INJECTION: 30 mg/mL (1 mL) | INTRAMUSCULAR | Qty: 1

## 2019-03-24 MED FILL — NORMAL SALINE FLUSH 0.9 % INJECTION SYRINGE: INTRAMUSCULAR | Qty: 10

## 2019-03-24 NOTE — ED Provider Notes (Signed)
28 yr old female with IDDM and fibromyalgia, returns to the ED with continued n/v after being seen here last night for the same. Symptoms started a little over 48 hours ago. She did also have diarrhea for the first 24 hours of the illness, but this has resolved. Pt felt significantly improved at the time of discharge yesterday. She was discharged home with zofran, however symptoms have continued at home with this medication. She has been in contact with her PCP who increased the zofran to 8mg  per dose, however this is only helpful for a limited amount of time. She has been drinking fluids, but has not been able to eat. She reports her abdomen is very uncomfortable, but no sig abd pain. She is now also having a burning sensation in her chest and throat from continues emesis. She has not had any fevers. No known sick contacts. She denies any dysuria. No vaginal symptoms. Pt has continued to check her blood sugar with no hypo or sig hyperglycemia.  No other issues at this time.            Past Medical History:   Diagnosis Date   ??? 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 Fibromyalgia        History reviewed. No pertinent surgical history.      History reviewed. No pertinent family history.    Social History     Socioeconomic History   ??? Marital status: SINGLE     Spouse name: Not on file   ??? Number of children: Not on file   ??? Years of education: Not on file   ??? Highest education level: Not on file   Occupational History   ??? Not on file   Social Needs   ??? Financial resource strain: Not on file   ??? Food insecurity     Worry: Not on file     Inability: Not on file   ??? Transportation needs     Medical: Not on file     Non-medical: Not on file   Tobacco Use   ??? Smoking status: Never Smoker   ??? Smokeless tobacco: Current User   Substance and Sexual Activity   ??? Alcohol use: Not Currently   ??? Drug use: Yes     Types: Marijuana   ??? Sexual activity: Not on file   Lifestyle   ??? Physical activity      Days per week: Not on file     Minutes per session: Not on file   ??? Stress: Not on file   Relationships   ??? Social Wellsite geologistconnections     Talks on phone: Not on file     Gets together: Not on file     Attends religious service: Not on file     Active member of club or organization: Not on file     Attends meetings of clubs or organizations: Not on file     Relationship status: Not on file   ??? Intimate partner violence     Fear of current or ex partner: Not on file     Emotionally abused: Not on file     Physically abused: Not on file     Forced sexual activity: Not on file   Other Topics Concern   ??? Not on file   Social History Narrative   ??? Not on file         ALLERGIES: Patient has no known allergies.    Review  of Systems   Constitutional: Positive for activity change, appetite change, chills and fatigue. Negative for diaphoresis, fever and unexpected weight change.   HENT: Negative for congestion, rhinorrhea, sinus pressure, sneezing, sore throat and trouble swallowing.    Eyes: Negative for photophobia and visual disturbance.   Respiratory: Positive for chest tightness. Negative for cough, shortness of breath and wheezing.    Cardiovascular: Negative for chest pain, palpitations and leg swelling.   Gastrointestinal: Positive for abdominal pain, nausea and vomiting. Negative for blood in stool, constipation and diarrhea.   Genitourinary: Positive for decreased urine volume. Negative for difficulty urinating, dysuria, flank pain, frequency, hematuria, menstrual problem, pelvic pain and vaginal discharge.   Musculoskeletal: Positive for myalgias. Negative for back pain, joint swelling, neck pain and neck stiffness.   Skin: Negative for rash and wound.   Allergic/Immunologic: Negative.    Neurological: Negative for dizziness, syncope, weakness, light-headedness, numbness and headaches.   Hematological: Negative.    Psychiatric/Behavioral: Positive for sleep disturbance. Negative for confusion and suicidal ideas.        Vitals:    03/24/19 0409   BP: 115/72   Pulse: 96   Resp: 14   Temp: 97.9 ??F (36.6 ??C)   SpO2: 96%   Weight: 75 kg (165 lb 5.5 oz)   Height: 5\' 7"  (1.702 m)            Physical Exam  Vitals signs reviewed.   Constitutional:       General: She is in acute distress.      Appearance: She is well-developed. She is ill-appearing. She is not toxic-appearing or diaphoretic.   HENT:      Head: Atraumatic.      Right Ear: External ear normal.      Left Ear: External ear normal.      Mouth/Throat:      Mouth: Mucous membranes are moist.      Pharynx: No oropharyngeal exudate.   Eyes:      Conjunctiva/sclera: Conjunctivae normal.      Pupils: Pupils are equal, round, and reactive to light.   Neck:      Musculoskeletal: Normal range of motion and neck supple.   Cardiovascular:      Rate and Rhythm: Normal rate and regular rhythm.      Heart sounds: Normal heart sounds. No murmur.   Pulmonary:      Effort: Pulmonary effort is normal. No respiratory distress.      Breath sounds: Normal breath sounds. No wheezing or rales.   Abdominal:      General: Bowel sounds are normal. There is no distension.      Palpations: Abdomen is soft. There is no mass.      Tenderness: There is no abdominal tenderness. There is no guarding or rebound.   Musculoskeletal: Normal range of motion.         General: No tenderness or deformity.   Lymphadenopathy:      Cervical: No cervical adenopathy.   Skin:     General: Skin is warm and dry.      Findings: No erythema or rash.   Neurological:      General: No focal deficit present.      Mental Status: She is alert and oriented to person, place, and time.      Cranial Nerves: No cranial nerve deficit.      Coordination: Coordination normal.      Deep Tendon Reflexes: Reflexes normal.   Psychiatric:  Behavior: Behavior normal.         Thought Content: Thought content normal.         Judgment: Judgment normal.          MDM  Number of Diagnoses or Management Options   Diagnosis management comments: 28 yr  old female with presents to the ED with continued n/v that started a little over 48 hours ago. Pt is afebrile, non toxic in appearance with nl vital signs. She is alert with no AMS. Pt has no sig discomfort to abdominal palpation with no  peritoneal signs. Physical exam is otherwise unremarkable. CBG of 125.  IV access  obtained and pt given 1L NS. pt given phenergan and toradol for symptomatic management.  WBC today is 13.3. This is down from 18.2 yesterday. However Cr is 1.94. This is up from 1.62 yesterday. Electrolytes are normal. No anion gap or acidosis. No indication of DKA. Nl hepatic function. Lipase is normal.   CT abd/pelvis without contrast shows no acute findings.     On repeat exam, pt feels much improved. I have discussed all findings with pt and mother including renal insufficiency. I have offered admission for continued observation and rehydration. She is requesting to be discharged home. Mother is in agreement with this.    She continues to be afebrile, with nl vital signs and no peritoneal signs. No indication of trauma, serious bacterial infection, or an acute abdomen. Based on this, I feel pt is safe to be discharged home with her mother and follow up from PCP. Discussed all findings with pt who feels reassured, in agreement with plan and ready for discharge. Pt discharged home in good condition. All indications for emergent re-evaluation and  possible medication side effects discussed with pt prior to discharge and she expressed understanding. She was given strong return precautions.        Amount and/or Complexity of Data Reviewed  Clinical lab tests: reviewed  Tests in the radiology section of CPT??: reviewed  Review and summarize past medical records: yes           Procedures      NIH Stroke Scale

## 2019-03-24 NOTE — ED Provider Notes (Signed)
28 yr old female with IDDM and fibromyalgia, returns to the ED with continued n/v after being seen here last night for the same. Symptoms started a little over 48 hours ago. She did also have diarrhea for the first 24 hours of the illness, but this has resolved. Pt felt significantly improved at the time of discharge yesterday. She was discharged home with zofran, however symptoms have continued at home with this medication. She has been in contact with her PCP who increased the zofran to 8mg  per dose, however this is only helpful for a limited amount of time. She has been drinking fluids, but has not been able to eat. She reports her abdomen is very uncomfortable, but no sig abd pain. She is now also having a burning sensation in her chest and throat from continues emesis. She has not had any fevers. No known sick contacts. She denies any dysuria. No vaginal symptoms. Pt has continued to check her blood sugar with no hypo or sig hyperglycemia.  No other issues at this time.            Past Medical History:   Diagnosis Date   ??? 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 Fibromyalgia        History reviewed. No pertinent surgical history.      History reviewed. No pertinent family history.    Social History     Socioeconomic History   ??? Marital status: SINGLE     Spouse name: Not on file   ??? Number of children: Not on file   ??? Years of education: Not on file   ??? Highest education level: Not on file   Occupational History   ??? Not on file   Social Needs   ??? Financial resource strain: Not on file   ??? Food insecurity     Worry: Not on file     Inability: Not on file   ??? Transportation needs     Medical: Not on file     Non-medical: Not on file   Tobacco Use   ??? Smoking status: Never Smoker   ??? Smokeless tobacco: Current User   Substance and Sexual Activity   ??? Alcohol use: Not Currently   ??? Drug use: Yes     Types: Marijuana   ??? Sexual activity: Not on file   Lifestyle   ??? Physical activity     Days per week: Not on file      Minutes per session: Not on file   ??? Stress: Not on file   Relationships   ??? Social Wellsite geologistconnections     Talks on phone: Not on file     Gets together: Not on file     Attends religious service: Not on file     Active member of club or organization: Not on file     Attends meetings of clubs or organizations: Not on file     Relationship status: Not on file   ??? Intimate partner violence     Fear of current or ex partner: Not on file     Emotionally abused: Not on file     Physically abused: Not on file     Forced sexual activity: Not on file   Other Topics Concern   ??? Not on file   Social History Narrative   ??? Not on file         ALLERGIES: Patient has no known allergies.    Review  of Systems   Constitutional: Positive for activity change, appetite change, chills and fatigue. Negative for diaphoresis, fever and unexpected weight change.   HENT: Negative for congestion, rhinorrhea, sinus pressure, sneezing, sore throat and trouble swallowing.    Eyes: Negative for photophobia and visual disturbance.   Respiratory: Positive for chest tightness. Negative for cough, shortness of breath and wheezing.    Cardiovascular: Negative for chest pain, palpitations and leg swelling.   Gastrointestinal: Positive for abdominal pain, nausea and vomiting. Negative for blood in stool, constipation and diarrhea.   Genitourinary: Positive for decreased urine volume. Negative for difficulty urinating, dysuria, flank pain, frequency, hematuria, menstrual problem, pelvic pain and vaginal discharge.   Musculoskeletal: Positive for myalgias. Negative for back pain, joint swelling, neck pain and neck stiffness.   Skin: Negative for rash and wound.   Allergic/Immunologic: Negative.    Neurological: Negative for dizziness, syncope, weakness, light-headedness, numbness and headaches.   Hematological: Negative.    Psychiatric/Behavioral: Positive for sleep disturbance. Negative for confusion and suicidal ideas.       Vitals:    03/24/19 0409   BP:  115/72   Pulse: 96   Resp: 14   Temp: 97.9 ??F (36.6 ??C)   SpO2: 96%   Weight: 75 kg (165 lb 5.5 oz)   Height: 5\' 7"  (1.702 m)            Physical Exam  Vitals signs reviewed.   Constitutional:       General: She is in acute distress.      Appearance: She is well-developed. She is ill-appearing. She is not toxic-appearing or diaphoretic.   HENT:      Head: Atraumatic.      Right Ear: External ear normal.      Left Ear: External ear normal.      Mouth/Throat:      Mouth: Mucous membranes are moist.      Pharynx: No oropharyngeal exudate.   Eyes:      Conjunctiva/sclera: Conjunctivae normal.      Pupils: Pupils are equal, round, and reactive to light.   Neck:      Musculoskeletal: Normal range of motion and neck supple.   Cardiovascular:      Rate and Rhythm: Normal rate and regular rhythm.      Heart sounds: Normal heart sounds. No murmur.   Pulmonary:      Effort: Pulmonary effort is normal. No respiratory distress.      Breath sounds: Normal breath sounds. No wheezing or rales.   Abdominal:      General: Bowel sounds are normal. There is no distension.      Palpations: Abdomen is soft. There is no mass.      Tenderness: There is no abdominal tenderness. There is no guarding or rebound.   Musculoskeletal: Normal range of motion.         General: No tenderness or deformity.   Lymphadenopathy:      Cervical: No cervical adenopathy.   Skin:     General: Skin is warm and dry.      Findings: No erythema or rash.   Neurological:      General: No focal deficit present.      Mental Status: She is alert and oriented to person, place, and time.      Cranial Nerves: No cranial nerve deficit.      Coordination: Coordination normal.      Deep Tendon Reflexes: Reflexes normal.   Psychiatric:  Behavior: Behavior normal.         Thought Content: Thought content normal.         Judgment: Judgment normal.          MDM  Number of Diagnoses or Management Options  Diagnosis management comments: 28 yr  old female with presents to  the ED with continued n/v that started a little over 48 hours ago. Pt is afebrile, non toxic in appearance with nl vital signs. She is alert with no AMS. Pt has no sig discomfort to abdominal palpation with no  peritoneal signs. Physical exam is otherwise unremarkable. CBG of 125.  IV access  obtained and pt given 1L NS. pt given phenergan and toradol for symptomatic management.  WBC today is 13.3. This is down from 18.2 yesterday. However Cr is 1.94. This is up from 1.62 yesterday. Electrolytes are normal. No anion gap or acidosis. No indication of DKA. Nl hepatic function. Lipase is normal.   CT abd/pelvis without contrast shows no acute findings.     On repeat exam, pt feels much improved. I have discussed all findings with pt and mother including renal insufficiency. I have offered admission for continued observation and rehydration. She is requesting to be discharged home. Mother is in agreement with this.    She continues to be afebrile, with nl vital signs and no peritoneal signs. No indication of trauma, serious bacterial infection, or an acute abdomen. Based on this, I feel pt is safe to be discharged home with her mother and follow up from PCP. Discussed all findings with pt who feels reassured, in agreement with plan and ready for discharge. Pt discharged home in good condition. All indications for emergent re-evaluation and  possible medication side effects discussed with pt prior to discharge and she expressed understanding. She was given strong return precautions.        Amount and/or Complexity of Data Reviewed  Clinical lab tests: reviewed  Tests in the radiology section of CPT??: reviewed  Review and summarize past medical records: yes           Procedures      NIH Stroke Scale

## 2019-03-25 ENCOUNTER — Inpatient Hospital Stay
Admit: 2019-03-25 | Discharge: 2019-03-26 | Disposition: A | Discharge status: Home / Self Care | Payer: PRIVATE HEALTH INSURANCE | Attending: Medical

## 2019-03-25 ENCOUNTER — Emergency Department
Admit: 2019-03-25 | Payer: PRIVATE HEALTH INSURANCE | Primary: Student in an Organized Health Care Education/Training Program

## 2019-03-25 DIAGNOSIS — N179 Acute kidney failure, unspecified: Secondary | ICD-10-CM

## 2019-03-25 LAB — URINE MICROSCOPIC WITH REFLEX CULTURE
WBC, UA: NONE SEEN /hpf (ref <5)
WBC: NONE SEEN /hpf (ref <5)

## 2019-03-25 LAB — URINALYSIS WITH REFLEX TO CULTURE
Bilirubin, Urine: NEGATIVE
Blood, Urine: NEGATIVE
Glucose, Ur: NEGATIVE mg/dL
Ketones, Urine: >=160 mg/dL — AB
Leukocyte Esterase, Urine: NEGATIVE
Nitrite, Urine: NEGATIVE
Protein, UA: 30 mg/dL — AB
Specific Gravity, UA: 1.027 NA (ref 1.001–1.035)
Urobilinogen, UA, POCT: 0.2 EU/dL (ref 0.2–1.0)
pH, UA: 6.5 NA (ref 5–8)

## 2019-03-25 LAB — CBC WITH AUTO DIFFERENTIAL
Basophils %: 1 % (ref 0–1.2)
Basophils Absolute: 0.1 10*3/uL (ref 0.0–0.1)
Eosinophils %: 4 %
Eosinophils Absolute: 0.3 10*3/uL (ref 0.0–0.5)
Granulocyte Absolute Count: 0 10*3/uL (ref 0.00–0.03)
Hematocrit: 38.6 % (ref 36.0–45.0)
Hemoglobin: 13.4 g/dL (ref 11.8–14.8)
Immature Granulocytes: 0 % (ref 0.0–0.4)
Lymphocytes %: 22 %
Lymphocytes Absolute: 1.7 10*3/uL (ref 1.2–3.7)
MCH: 31.5 PG (ref 25.6–32.2)
MCHC: 34.7 g/dL (ref 32.2–35.5)
MCV: 90.6 FL (ref 80.0–95.0)
MPV: 10.2 FL (ref 9.4–12.4)
Monocytes %: 10 %
Monocytes Absolute: 0.8 10*3/uL (ref 0.2–0.8)
Neutrophils %: 63 %
Neutrophils Absolute: 4.9 10*3/uL (ref 1.6–6.1)
Platelets: 264 10*3/uL (ref 150–400)
RBC: 4.26 M/uL (ref 3.93–5.22)
RDW: 11.8 % (ref 11.6–14.4)
WBC: 7.7 10*3/uL (ref 4.0–10.0)

## 2019-03-25 LAB — COMPREHENSIVE METABOLIC PANEL
ALT: 31 U/L (ref 13–56)
AST: 22 U/L (ref 15–37)
Albumin/Globulin Ratio: 1 (ref 1.0–3.0)
Albumin: 3.8 g/dL (ref 3.4–5.0)
Alkaline Phosphatase: 57 U/L (ref 45–117)
Anion Gap: 11 mmol/L (ref 5–15)
BUN: 37 MG/DL — ABNORMAL HIGH (ref 7–18)
Bun/Cre Ratio: 24 NA — ABNORMAL HIGH (ref 12–20)
CO2: 28 mmol/L (ref 21–32)
Calcium: 8.4 MG/DL — ABNORMAL LOW (ref 8.5–10.1)
Chloride: 101 mmol/L (ref 98–110)
Creatinine: 1.54 MG/DL — ABNORMAL HIGH (ref 0.55–1.02)
EGFR IF NonAfrican American: 43 mL/min/{1.73_m2} — ABNORMAL LOW (ref >60)
GFR African American: 49 mL/min/{1.73_m2} — ABNORMAL LOW (ref >60)
Glucose: 152 mg/dL — ABNORMAL HIGH (ref 70–100)
Potassium: 3.7 mmol/L (ref 3.5–5.1)
Sodium: 140 mmol/L (ref 136–145)
Total Bilirubin: 0.68 mg/dL (ref 0.20–1.20)
Total Protein: 7.8 g/dL (ref 6.4–8.2)

## 2019-03-25 LAB — COVID-19: SARS-CoV-2, Rapid: NOT DETECTED

## 2019-03-25 LAB — ACETONE/KETONE, QL
Acetone/Ketone serum, QL.: 20 — AB
Acetone/Ketone, Serum: 20 — AB

## 2019-03-25 LAB — UA WITH REFLEX MICRO AND CULTURE
Bilirubin: NEGATIVE
Blood: NEGATIVE
Glucose: NEGATIVE mg/dL
Ketone: >=160 mg/dL — AB
Leukocyte Esterase: NEGATIVE
Nitrites: NEGATIVE
Protein: 30 mg/dL — AB
Specific gravity: 1.027 (ref 1.001–1.035)
Urobilinogen: 0.2 EU/dL (ref 0.2–1.0)
pH (UA): 6.5 (ref 5–8)

## 2019-03-25 LAB — CBC WITH AUTOMATED DIFF
ABS. BASOPHILS: 0.1 10*3/uL (ref 0.0–0.1)
ABS. EOSINOPHILS: 0.3 10*3/uL (ref 0.0–0.5)
ABS. IMM. GRANS.: 0 10*3/uL (ref 0.00–0.03)
ABS. LYMPHOCYTES: 1.7 10*3/uL (ref 1.2–3.7)
ABS. MONOCYTES: 0.8 10*3/uL (ref 0.2–0.8)
ABS. NEUTROPHILS: 4.9 10*3/uL (ref 1.6–6.1)
BASOPHILS: 1 % (ref 0–1.2)
EOSINOPHILS: 4 %
HCT: 38.6 % (ref 36.0–45.0)
HGB: 13.4 g/dL (ref 11.8–14.8)
IMMATURE GRANULOCYTES: 0 % (ref 0.0–0.4)
LYMPHOCYTES: 22 %
MCH: 31.5 PG (ref 25.6–32.2)
MCHC: 34.7 g/dL (ref 32.2–35.5)
MCV: 90.6 FL (ref 80.0–95.0)
MONOCYTES: 10 %
MPV: 10.2 FL (ref 9.4–12.4)
NEUTROPHILS: 63 %
PLATELET: 264 10*3/uL (ref 150–400)
RBC: 4.26 M/uL (ref 3.93–5.22)
RDW: 11.8 % (ref 11.6–14.4)
WBC: 7.7 10*3/uL (ref 4.0–10.0)

## 2019-03-25 LAB — METABOLIC PANEL, COMPREHENSIVE
A-G Ratio: 1 (ref 1.0–3.0)
ALT (SGPT): 31 U/L (ref 13–56)
AST (SGOT): 22 U/L (ref 15–37)
Albumin: 3.8 g/dL (ref 3.4–5.0)
Alk. phosphatase: 57 U/L (ref 45–117)
Anion gap: 11 mmol/L (ref 5–15)
BUN/Creatinine ratio: 24 — ABNORMAL HIGH (ref 12–20)
BUN: 37 MG/DL — ABNORMAL HIGH (ref 7–18)
Bilirubin, total: 0.68 mg/dL (ref 0.20–1.20)
CO2: 28 mmol/L (ref 21–32)
Calcium: 8.4 MG/DL — ABNORMAL LOW (ref 8.5–10.1)
Chloride: 101 mmol/L (ref 98–110)
Creatinine: 1.54 MG/DL — ABNORMAL HIGH (ref 0.55–1.02)
GFR est AA: 49 mL/min/{1.73_m2} — ABNORMAL LOW (ref >60)
GFR est non-AA: 43 mL/min/{1.73_m2} — ABNORMAL LOW (ref >60)
Glucose: 152 mg/dL — ABNORMAL HIGH (ref 70–100)
Potassium: 3.7 mmol/L (ref 3.5–5.1)
Protein, total: 7.8 g/dL (ref 6.4–8.2)
Sodium: 140 mmol/L (ref 136–145)

## 2019-03-25 LAB — SARS-COV-2: COVID-19 rapid test: NOT DETECTED

## 2019-03-25 MED ORDER — SODIUM CHLORIDE 0.9 % IJ SYRG
Freq: Once | INTRAMUSCULAR | Status: AC
Start: 2019-03-25 — End: 2019-03-25
  Administered 2019-03-25: 21:00:00 via INTRAVENOUS

## 2019-03-25 MED ORDER — HALOPERIDOL LACTATE 5 MG/ML IJ SOLN
5 mg/mL | INTRAMUSCULAR | Status: AC
Start: 2019-03-25 — End: 2019-03-25
  Administered 2019-03-25: 21:00:00 via INTRAVENOUS

## 2019-03-25 MED ORDER — SODIUM CHLORIDE 0.9 % INJECTION
5 mg/mL | INTRAMUSCULAR | Status: AC
Start: 2019-03-25 — End: 2019-03-25
  Administered 2019-03-25: via INTRAVENOUS

## 2019-03-25 MED ORDER — CAPSAICIN 0.025 % TOPICAL CREAM
0.025 % | Freq: Once | CUTANEOUS | Status: AC
Start: 2019-03-25 — End: 2019-03-25
  Administered 2019-03-25: via TOPICAL

## 2019-03-25 MED ORDER — SODIUM CHLORIDE 0.9% BOLUS IV
0.9 % | Freq: Once | INTRAVENOUS | Status: AC
Start: 2019-03-25 — End: 2019-03-25
  Administered 2019-03-25: 21:00:00 via INTRAVENOUS

## 2019-03-25 MED ORDER — SODIUM CHLORIDE 0.9% BOLUS IV
0.9 % | Freq: Once | INTRAVENOUS | Status: AC
Start: 2019-03-25 — End: 2019-03-25
  Administered 2019-03-25: 20:00:00 via INTRAVENOUS

## 2019-03-25 MED ORDER — SODIUM CHLORIDE 0.9 % IV
Freq: Once | INTRAVENOUS | Status: AC
Start: 2019-03-25 — End: 2019-03-26
  Administered 2019-03-25: via INTRAVENOUS

## 2019-03-25 MED ORDER — ONDANSETRON (PF) 4 MG/2 ML INJECTION
4 mg/2 mL | Freq: Once | INTRAMUSCULAR | Status: AC
Start: 2019-03-25 — End: 2019-03-25
  Administered 2019-03-25: 20:00:00 via INTRAVENOUS

## 2019-03-25 MED ORDER — LORAZEPAM 2 MG/ML IJ SOLN
2 mg/mL | Freq: Once | INTRAMUSCULAR | Status: AC
Start: 2019-03-25 — End: 2019-03-25
  Administered 2019-03-26: via INTRAVENOUS

## 2019-03-25 MED ORDER — PANTOPRAZOLE 40 MG IV SOLR
40 mg | Freq: Every day | INTRAVENOUS | Status: DC
Start: 2019-03-25 — End: 2019-03-26
  Administered 2019-03-26 (×2): via INTRAVENOUS

## 2019-03-25 MED FILL — SODIUM CHLORIDE 0.9 % IV: INTRAVENOUS | Qty: 1000

## 2019-03-25 MED FILL — CAPSAICIN 0.025 % TOPICAL CREAM: 0.025 % | CUTANEOUS | Qty: 60

## 2019-03-25 MED FILL — HALOPERIDOL LACTATE 5 MG/ML IJ SOLN: 5 mg/mL | INTRAMUSCULAR | Qty: 1

## 2019-03-25 MED FILL — PROCHLORPERAZINE EDISYLATE 5 MG/ML INJECTION: 5 mg/mL | INTRAMUSCULAR | Qty: 2

## 2019-03-25 MED FILL — ONDANSETRON (PF) 4 MG/2 ML INJECTION: 4 mg/2 mL | INTRAMUSCULAR | Qty: 2

## 2019-03-25 MED FILL — NORMAL SALINE FLUSH 0.9 % INJECTION SYRINGE: INTRAMUSCULAR | Qty: 10

## 2019-03-25 NOTE — ED Notes (Signed)
Pt medicated per MD orders. Pt cont to refuse repeat/send out covid swab despite encouragement from this RN. Pt and mother updated on wait time for admission. Pt denies any further needs at this time. Call light within reach.

## 2019-03-25 NOTE — ED Triage Notes (Addendum)
Patient returns to the ED with continued nausea and vomiting for about 3 days now. This is her 3rd visit in 3 days for the same symptoms. She did also have diarrhea for the first 24 hours of the illness, but that had resolved and mom states that her diarrhea returned today. Denies fever or chills at this time. Blood sugars have been stable she has a dexcom. The violent vomiting has caused her to not being able to keep anything down. The medications she was given hasnt been helping.

## 2019-03-25 NOTE — H&P (Signed)
Kathryn [name]  April 19, 1991  03/25/2019    '@CHIEF'  COMPLAINT@  Chief Complaint   Patient presents with   ??? Vomiting   ??? Extremity Weakness     Subjective:     '@History'  Present Illness@  Kathryn [name] is a 28 y.o. Caucasian female patient with a PMHx of Diabetes Type I (onset 28 yrs old) Fibromyalgia who has been seen in the ED on 8/1&09/2018 and returns today with complaints of N/V with abdominal to epigastric discomfort, diarrhea unable to hold food down. The patient is accompanied by her mother who is assisting with the timeline of events had gone over her friends house last Thursday night, the next day she started to have N/V/D with abdominal cramping. 8/1 the patient in the ED was noted to have a Cr 1.62 BUN 42 it was felt that she was dehydrated no indication of DKA, negative pregnancy, 1L N/S administered with zofran and toradol for symptomatic management, the patient was discharged with Zofran p.r.n.. On 8/2 the patient returned to the ED with same symptoms of N/V/D, her PCP had increase the Zofran to 8 mg per dose the patient had been drinking fluids but unable to eat she continued to complain of abdominal discomfort and burning in her throat and chest secondary to emesis, her WBC was 13.3 down from 18.2, her creatinine was elevated to 1.94 no DKA noted CT scan of the abdomen/pelvis without contrast showed no acute findings, the  patient requested to go home did not want to stay as inpatient mother agreed the patient was discharged with compazine for nausea.     Today the patient presents back to the ED with her mother, unable to eat due to N/V/D, burning pain in her chest and throat due to vomiting, complains of cough and SOB denies any contact with anyone who has been sick, although she did stay over a friends house this past Thursday.  Patient did travel to [place], several weeks ago as well.  Majority of the history and timeline of events a given by the patient's mother patient  only was point answers at times or using her fingers to answer questions.  The patient has not been taking her insulin because she has not been able to eat, her mother states they been tracking her glucose and it ranges from 125 to 198. EKG sinus rhythm QTC 447 no ST elevation or depressions noted, the patient admits to smoking marijuana every night, chest x-ray no acute pathology Covid 19 obtain. Labs:  WBC 7.7, H&H 13.4/38.6, NA 140, K 3.7, anion gap 11, BS 152, BUN 37, CR 1.54, CxR no evidence for pneumonia or CHF no acute cardiopulmonary findings V/S T 97.2??, HR 82, R 18, BP 142/86, SaO2 99% RA  Past Medical History:   Diagnosis Date   ??? Diabetes 1.5, managed as type 1 ([place])    ??? Fibromyalgia       Past Surgical History:   Procedure Laterality Date   ??? HX OTHER SURGICAL      mother states no history of surgery      Family History   Problem Relation Age of Onset   ??? Atrial Fibrillation Mother    ??? Other Father       Social History     Tobacco Use   ??? Smoking status: Never Smoker   ??? Smokeless tobacco: Current User   Substance Use Topics   ??? Alcohol use: Not Currently      Prior to Admission medications  Medication Sig Start Date End Date Taking? Authorizing Provider   DULoxetine (CYMBALTA) 60 mg capsule Take 60 mg by mouth daily. 01/16/19  Yes Provider, Historical   insulin glargine (Lantus Solostar U-100 Insulin) 100 unit/mL (3 mL) inpn 22 Units by SubCUTAneous route At bedtime. 11/22/18  Yes Provider, Historical   insulin lispro (HUMALOG) 100 unit/mL kwikpen 10 Units by SubCUTAneous route three (3) times daily (with meals). 11/22/18  Yes Provider, Historical   promethazine (PHENERGAN) 25 mg tablet Take 1 Tab by mouth every six (6) hours as needed for Nausea. 03/24/19  Yes Stefan Church, MD     No Known Allergies     Review of Systems   Constitutional: Positive for activity change, appetite change and chills. Negative for diaphoresis, fatigue and fever.    HENT: Positive for sore throat. Negative for congestion and trouble swallowing.    Eyes: Negative.    Respiratory: Positive for cough. Negative for shortness of breath.    Cardiovascular: Positive for chest pain. Negative for palpitations and leg swelling.   Gastrointestinal: Positive for abdominal pain, diarrhea, nausea and vomiting. Negative for abdominal distention.   Genitourinary: Negative.  Negative for dysuria and urgency.   Musculoskeletal: Positive for myalgias. Negative for gait problem, neck pain and neck stiffness.   Skin: Negative.    Neurological: Negative.    Hematological: Negative.    Psychiatric/Behavioral: The patient is nervous/anxious.      Objective:     Intake and Output:  No intake/output data recorded.  08/02 0701 - 08/03 1900  In: 2000 [I.V.:2000]  Out: -     Visit Vitals  BP 142/86   Pulse 82   Temp 97.2 ??F (36.2 ??C)   Resp 18   Ht '5\' 8"'  (1.727 m)   Wt 72.6 kg (160 lb)   SpO2 99%   BMI 24.33 kg/m??     Physical Exam  Vitals signs and nursing note reviewed.   Constitutional:       Comments: Only whispering answers to questions, or holding up her fingers to indicate the units of insulin she is taking.   HENT:      Head: Normocephalic and atraumatic.      Nose: Nose normal. No rhinorrhea.      Mouth/Throat:      Pharynx: Oropharynx is clear. No posterior oropharyngeal erythema.   Eyes:      General: No scleral icterus.     Conjunctiva/sclera: Conjunctivae normal.      Pupils: Pupils are equal, round, and reactive to light.   Neck:      Musculoskeletal: Normal range of motion and neck supple. No neck rigidity or muscular tenderness.      Vascular: No carotid bruit.   Cardiovascular:      Rate and Rhythm: Normal rate and regular rhythm.      Pulses: Normal pulses.      Heart sounds: Normal heart sounds.   Pulmonary:      Effort: Pulmonary effort is normal.      Breath sounds: Normal breath sounds.   Abdominal:      General: Bowel sounds are normal. There is no distension.       Palpations: Abdomen is soft.      Tenderness: There is abdominal tenderness.   Musculoskeletal: Normal range of motion.      Right lower leg: No edema.      Left lower leg: No edema.   Lymphadenopathy:      Cervical: No cervical adenopathy.  Skin:     General: Skin is warm and dry.      Capillary Refill: Capillary refill takes less than 2 seconds.   Neurological:      General: No focal deficit present.      Mental Status: She is alert and oriented to person, place, and time. Mental status is at baseline.   Psychiatric:         Mood and Affect: Mood normal.         Behavior: Behavior normal. Behavior is cooperative.       Data Review:   Recent Results (from the past 24 hour(s))   CBC WITH AUTOMATED DIFF    Collection Time: 03/25/19  3:47 PM   Result Value Ref Range    WBC 7.7 4.0 - 10.0 K/uL    RBC 4.26 3.93 - 5.22 M/uL    HGB 13.4 11.8 - 14.8 g/dL    HCT 38.6 36.0 - 45.0 %    MCV 90.6 80.0 - 95.0 FL    MCH 31.5 25.6 - 32.2 PG    MCHC 34.7 32.2 - 35.5 g/dL    RDW 11.8 11.6 - 14.4 %    PLATELET 264 150 - 400 K/uL    MPV 10.2 9.4 - 12.4 FL    NEUTROPHILS 63 %    LYMPHOCYTES 22 %    MONOCYTES 10 %    EOSINOPHILS 4 %    BASOPHILS 1 0 - 1.2 %    ABS. NEUTROPHILS 4.9 1.6 - 6.1 K/UL    ABS. LYMPHOCYTES 1.7 1.2 - 3.7 K/UL    ABS. MONOCYTES 0.8 0.2 - 0.8 K/UL    ABS. EOSINOPHILS 0.3 0.0 - 0.5 K/UL    ABS. BASOPHILS 0.1 0.0 - 0.1 K/UL    DF AUTOMATED     IMMATURE GRANULOCYTES 0 0.0 - 0.4 %    ABS. IMM. GRANS. 0.0 0.00 - 5.57 K/UL   METABOLIC PANEL, COMPREHENSIVE    Collection Time: 03/25/19  3:47 PM   Result Value Ref Range    Sodium 140 136 - 145 mmol/L    Potassium 3.7 3.5 - 5.1 mmol/L    Chloride 101 98 - 110 mmol/L    CO2 28 21 - 32 mmol/L    Anion gap 11 5 - 15 mmol/L    Glucose 152 (H) 70 - 100 mg/dL    BUN 37 (H) 7 - 18 MG/DL    Creatinine 1.54 (H) 0.55 - 1.02 MG/DL    BUN/Creatinine ratio 24 (H) 12 - 20    GFR est AA 49 (L) >60 ml/min/1.33m    GFR est non-AA 43 (L) >60 ml/min/1.781m    Calcium 8.4 (L) 8.5 - 10.1 MG/DL    Bilirubin, total 0.68 0.20 - 1.20 mg/dL    ALT (SGPT) 31 13 - 56 U/L    AST (SGOT) 22 15 - 37 U/L    Alk. phosphatase 57 45 - 117 U/L    Protein, total 7.8 6.4 - 8.2 g/dL    Albumin 3.8 3.4 - 5.0 g/dL    A-G Ratio 1.0 1.0 - 3.0     SARS-COV-2    Collection Time: 03/25/19  3:47 PM   Result Value Ref Range    Specimen source THROAT     COVID-19 rapid test Not detected Not detected   EKG, 12 LEAD, INITIAL    Collection Time: 03/25/19  4:48 PM   Result Value Ref Range    Heart Rate 84 bpm  RR Interval 714 ms    Atrial Rate 84 ms    P-R Interval 161 ms    P Duration 125 ms    P Horizontal Axis 17 deg    P Front Axis 78 deg    Q Onset 504 ms    QRSD Interval 90 ms    QT Interval 378 ms    QTcB 447 ms    QTcF 423 ms    QRS Horizontal Axis -51 deg    QRS Axis 111 deg    I-40 Horizontal Axis -25 deg    I-40 Front Axis 94 deg    T-40 Horizontal Axis -70 deg    T-40 Front Axis 137 deg    T Horizontal Axis 25 deg    T Wave Axis 62 deg    S-T Horizontal Axis 83 deg    S-T Front Axis 30 deg   UA WITH REFLEX MICRO AND CULTURE    Collection Time: 03/25/19  6:42 PM    Specimen: Urine   Result Value Ref Range    Color YELLOW YELLOW    Appearance CLEAR CLEAR    Specific gravity 1.027 1.001 - 1.035    pH (UA) 6.5 5 - 8    Protein 30 (A) Negative mg/dL    Glucose Negative Negative mg/dL    Ketone >=160 (A) Negative mg/dL    Bilirubin Negative Negative    Blood Negative Negative    Urobilinogen 0.2 0.2 - 1.0 EU/dL    Nitrites Negative Negative    Leukocyte Esterase Negative Negative   URINE MICROSCOPIC WITH REFLEX CULTURE    Collection Time: 03/25/19  6:42 PM    Specimen: Urine   Result Value Ref Range    WBC NONE SEEN <5 /hpf    RBC 0-4 <5 /hpf    Bacteria TRACE (A) NONE SEEN /hpf    UA:UC IF INDICATED CULTURE NOT INDICATED BY UA RESULT     Epithelial cells 0-4  SQUAMOUS   <5 /hpf   ACETONE/KETONE, QL    Collection Time: 03/25/19  7:30 PM   Result Value Ref Range     Acetone/Ketone serum, QL. 20 mg/dL (A) Negative          Xr Chest Sngl V    Result Date: 03/25/2019  : THERE ARE NO ACUTE CARDIOPULMONARY FINDINGS SEEN. J:  [id number] D:  03/25/2019 15:48:05 T:  03/25/2019 15:55:30     Old records reviewed.   Assessment:     Active Problems:    AKI (acute kidney injury) ([place]) (03/25/2019)      Plan:   In Summary Kathryn [name] 28 yo has had N/V/D and abdominal discomfort with burning in her throat and chest for 3 days, symptoms started after she came home from staying over her friend's house Thursday night.    1. AKI in the setting of N/V/D suspect secondary to marijuana use patient smokes every night the patient's mother states it was ordered for her fibromyalgia    ?? Will admit patient as inpatient status  ?? IV normal saline continuous infusion  ?? Tylenol 1000 mg every 8 hours p.r.n. as needed  ?? Protonix 40 mg IV daily  ?? Compazine 10 mg IV while in the ED  ?? Reglan 10 mg IV every 6 hours p.r.n.  ?? Zofran 8 mg every 8 hours p.r.n.  ?? Capsicum 0.25% cream x1  ?? Ativan 1 mg IV x1, then every 4 hours p.r.n. as needed  ?? CBC metabolic panel in  a.m.    2. Diabetes type 1 insulin-dependent, patient has not been taking her insulin, her mother states they have been tracking her BS ranging from 125-198.  ?? Without DKA noted at this time   ?? Lantus 10 units at bedtime  ?? Will place patient on insulin sliding scale sensitive level tier 1 BS a.c. and HS  ?? A1c in a.m.    3. Anxiety continue Cymbalta 60 mg daily    4. DVT prophylaxis bilateral mechanical sequential devices    5. Code status patient is a full code discussed with patient while mother was present    The patient's medications, assessment, labs and plan of care was discussed with Dr. Algie Coffer    This document was generated with the aid of voice recognition software. Please be aware that there may be inadvertent transcription errors not identified or corrected by the author  Signed By: Raylene Everts, NP     March 25, 2019

## 2019-03-25 NOTE — ED Notes (Signed)
Provider at bedside.

## 2019-03-25 NOTE — ED Notes (Signed)
ED LNA at bedside for EKG.

## 2019-03-25 NOTE — ED Notes (Signed)
Cup of ice water placed at bedside for PO trial.

## 2019-03-25 NOTE — ED Notes (Signed)
Pt ambulatory to and from bathroom without issue. Urine sample collected and sent to lab. Pt initially remained bundled up in blankets, sleeping on stretcher when mother came out to desk saying pt did ok with PO trial. This RN did not see pt sitting up drinking. Pt also reporting she feels nauseous again. Will notify provider.

## 2019-03-25 NOTE — ED Notes (Signed)
Note left for Kyle A., PA. Pt's mother requesting additional nausea medication as pt is continuing to vomit.

## 2019-03-25 NOTE — ED Provider Notes (Signed)
This is a 28 year old female patient with a history of diabetes, fibromyalgia presenting today for her 3rd visit in 3 days for abdominal discomfort, diarrhea, nausea, vomiting, weakness.  She has been given Compazine and Zofran in her last 2 visits which she states helps in the ER but does not help her when she gets home.  She was found to have worsening renal functions over the last 48 hours and was offered but declined admission yesterday.  She denies fevers chills or sweats.  She states that she now has a burning pain in her chest as well as a cough and shortness of breath.  She denies sick contacts.  Apparently traveled to [place] a few weeks ago.  Has had a significantly decreased appetite.  No black or bloody stools.  Has not had diarrhea in a couple days.  She had an unremarkable CT scan of her abdomen pelvis yesterday.           Past Medical History:   Diagnosis Date   ??? 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 Fibromyalgia        History reviewed. No pertinent surgical history.      History reviewed. No pertinent family history.    Social History     Socioeconomic History   ??? Marital status: SINGLE     Spouse name: Not on file   ??? Number of children: Not on file   ??? Years of education: Not on file   ??? Highest education level: Not on file   Occupational History   ??? Not on file   Social Needs   ??? Financial resource strain: Not on file   ??? Food insecurity     Worry: Not on file     Inability: Not on file   ??? Transportation needs     Medical: Not on file     Non-medical: Not on file   Tobacco Use   ??? Smoking status: Never Smoker   ??? Smokeless tobacco: Current User   Substance and Sexual Activity   ??? Alcohol use: Not Currently   ??? Drug use: Yes     Types: Marijuana   ??? Sexual activity: Not on file   Lifestyle   ??? Physical activity     Days per week: Not on file     Minutes per session: Not on file   ??? Stress: Not on file   Relationships   ??? Social Wellsite geologistconnections     Talks on phone: Not on file      Gets together: Not on file     Attends religious service: Not on file     Active member of club or organization: Not on file     Attends meetings of clubs or organizations: Not on file     Relationship status: Not on file   ??? Intimate partner violence     Fear of current or ex partner: Not on file     Emotionally abused: Not on file     Physically abused: Not on file     Forced sexual activity: Not on file   Other Topics Concern   ??? Not on file   Social History Narrative   ??? Not on file         ALLERGIES: Patient has no known allergies.    Review of Systems   Constitutional: Positive for activity change, appetite change and fatigue. Negative for chills, diaphoresis, fever and unexpected weight change.   Respiratory:  Positive for cough and shortness of breath. Negative for wheezing and stridor.    Cardiovascular: Positive for chest pain.   Gastrointestinal: Positive for abdominal pain, diarrhea, nausea and vomiting.   Genitourinary: Negative for decreased urine volume, dysuria, flank pain and urgency.   Musculoskeletal: Negative for arthralgias, joint swelling and myalgias.   Neurological: Positive for dizziness.   Psychiatric/Behavioral: Negative.        Vitals:    03/25/19 1516   BP: (!) 149/104   Pulse: 81   Resp: 18   Temp: 97.2 ??F (36.2 ??C)   SpO2: 99%   Weight: 72.6 kg (160 lb)   Height: 5\' 8"  (1.727 m)            Physical Exam  Vitals signs and nursing note reviewed.   Constitutional:       General: She is not in acute distress.     Appearance: Normal appearance. She is normal weight. She is ill-appearing (Nontoxic). She is not toxic-appearing or diaphoretic.   HENT:      Head: Normocephalic and atraumatic.   Cardiovascular:      Rate and Rhythm: Normal rate and regular rhythm.      Pulses: Normal pulses.      Heart sounds: Normal heart sounds.   Pulmonary:      Effort: Pulmonary effort is normal.      Breath sounds: Normal breath sounds.   Abdominal:      General: Abdomen is flat. Bowel sounds are normal.       Tenderness: There is abdominal tenderness. There is no guarding or rebound.   Musculoskeletal: Normal range of motion.   Skin:     General: Skin is warm and dry.      Capillary Refill: Capillary refill takes less than 2 seconds.   Neurological:      General: No focal deficit present.      Mental Status: She is alert and oriented to person, place, and time. Mental status is at baseline.   Psychiatric:         Mood and Affect: Mood normal.         Behavior: Behavior normal.         Thought Content: Thought content normal.         Judgment: Judgment normal.          MDM  Number of Diagnoses or Management Options  Diagnosis management comments: This is a pleasant 28 year old female patient presenting with mom for evaluation of about 4 days of weakness, abdominal pain, nausea, vomiting and now about 36 hours of cough and shortness of breath.  On exam she appears ill but nontoxic.  Resting with her eyes closed.  Refusing to answer questions.  Majority of the history was obtained from her mother.  Her abdomen appears diffusely tender without significant rebound or guarding.  Breath sounds are clear and intact throughout.  Vitals are unremarkable.  We will get a COVID-19 swab.  Chest x-ray.  Give fluids.  Zofran.  Re-evaluate.       Amount and/or Complexity of Data Reviewed  Clinical lab tests: reviewed and ordered  Tests in the radiology section of CPT??: reviewed and ordered  Tests in the medicine section of CPT??: ordered and reviewed      ED Course as of Mar 25 1919   Mon Mar 25, 2019   1634 Labs today are actually improved from previous.  Her creatinine has improved from 1.9-1.5.  There is no elevated CO2 or  anion gap.  No evidence of DKA or HHS.  Her urinalysis is pending.  Her chest x-ray does not show any acute pathology.  COVID-19 swab is pending.  We will give her some additional Compazine and Toradol.    [KA]   1653 EKG today shows a sinus rhythm at 84 beats per minute.  QTC 447. No  obvious ST elevations or depressions.  T-wave inversions in V1.  No signs of acute infarct or ischemia.    [DU]   2025 On re-evaluation patient reports that she does smoke marijuana every night.  It is quite possible that this could be cyclic vomiting syndrome.  She will be given a 2nd L of fluids.  We will give Haldol.  Will reassess.    [KA]   4270 Patient is sleeping but is easily arousable.  She feels much improved after Haldol.    [KA]   1901 Patient is reporting continued nausea.  She will be given capsaicin.  She was able to tolerate oral fluids.    [KA]   1914 She has greater than 160 ketones in her urine.  She is going to be admitted to the hospital for her hyper emesis and dehydration.  She is agreeable with this.    [KA]      ED Course User Index  [KA] Hulan Saas, PA       Procedures      NIH Stroke Scale

## 2019-03-25 NOTE — ED Notes (Signed)
Pt remains lying on back on stretcher with HOB elevated, appears to feel unwell. Assessment grossly unchanged. Hospitalist MD and NP at bedside. Pt medicated per provider orders. Pt requesting to not do send out Covid test at this time due to nausea. Mother remains at bedside and call light within reach.

## 2019-03-25 NOTE — ED Notes (Addendum)
Call placed to lab regarding add on acetone level.

## 2019-03-25 NOTE — ED Notes (Signed)
Call placed to pharmacy requesting capsicum cream be verified and sent to ED. Provider also to bedside at this time.

## 2019-03-25 NOTE — ED Notes (Signed)
Report given to Erin, RN on [facility]. Pt ready for transport.    TRANSFER - OUT REPORT:    Verbal report given to Erin, RN on Kathryn [name]  being transferred to [facility] for routine progression of care       Report consisted of patient???s Situation, Background, Assessment and   Recommendations(SBAR).     Information from the following report(s) SBAR, ED Summary, Intake/Output, MAR and Recent Results was reviewed with the receiving nurse.    Lines:   Peripheral IV 03/25/19 Right Antecubital (Active)   Site Assessment Clean, dry, & intact 03/25/19 1546   Phlebitis Assessment 0 03/25/19 1546   Infiltration Assessment 0 03/25/19 1546   Dressing Status Clean, dry, & intact 03/25/19 1546   Dressing Type Transparent 03/25/19 1546   Hub Color/Line Status Green 03/25/19 1546   Alcohol Cap Used No 03/25/19 1546        Opportunity for questions and clarification was provided.      Patient transported with:   [employer]

## 2019-03-25 NOTE — ED Notes (Signed)
Pt lying on stretcher with HOB elevated, appears to feel unwell, eyes closed. IVF completed, and new bag initiated. Pt medicated per provider order. Pt given ice pack to cool her forehead per pt request. Pt denies any further needs at this time. Mother at bedside and call light within reach. Will cont to monitor.

## 2019-03-25 NOTE — ED Notes (Signed)
Pt medicated per MD orders. Pt cont to refuse repeat/send out covid swab despite encouragement from this RN. Pt and mother updated on wait time for admission. Pt denies any further needs at this time. Call light within reach.

## 2019-03-25 NOTE — ED Notes (Signed)
Call placed to lab regarding add on acetone level.

## 2019-03-25 NOTE — ED Notes (Signed)
Pt lying on stretcher with HOB elevated, appears to feel unwell, eyes closed. IVF completed, and new bag initiated. Pt medicated per provider order. Pt given ice pack to cool her forehead per pt request. Pt denies any further needs at this time. Mother at bedside and call light within reach. Will cont to monitor.

## 2019-03-25 NOTE — ED Notes (Signed)
 Report given to Rocky, RN on [facility]. Pt ready for transport.    TRANSFER - OUT REPORT:    Verbal report given to Rocky, RN on Kathryn [name]  being transferred to [facility] for routine progression of care       Report consisted of patient's Situation, Background, Assessment and   Recommendations(SBAR).     Information from the following report(s) SBAR, ED Summary, Intake/Output, MAR and Recent Results was reviewed with the receiving nurse.    Lines:   Peripheral IV 03/25/19 Right Antecubital (Active)   Site Assessment Clean, dry, & intact 03/25/19 1546   Phlebitis Assessment 0 03/25/19 1546   Infiltration Assessment 0 03/25/19 1546   Dressing Status Clean, dry, & intact 03/25/19 1546   Dressing Type Transparent 03/25/19 1546   Hub Color/Line Status Green 03/25/19 1546   Alcohol Cap Used No 03/25/19 1546        Opportunity for questions and clarification was provided.      Patient transported with:   [employer]

## 2019-03-25 NOTE — H&P (Signed)
Kathryn [name]  10/19/1990  03/25/2019    '@CHIEF'  [place]  Chief Complaint   Patient presents with   ??? Vomiting   ??? Extremity Weakness     Subjective:     '@History'  Present Illness@  Kathryn [name] is a 28 y.o. Caucasian female patient with a PMHx of Diabetes Type I (onset 28 yrs old) Fibromyalgia who has been seen in the ED on 8/1&09/2018 and returns today with complaints of N/V with abdominal to epigastric discomfort, diarrhea unable to hold food down. The patient is accompanied by her mother who is assisting with the timeline of events had gone over her friends house last Thursday night, the next day she started to have N/V/D with abdominal cramping. 8/1 the patient in the ED was noted to have a Cr 1.62 BUN 42 it was felt that she was dehydrated no indication of DKA, negative pregnancy, 1L N/S administered with zofran and toradol for symptomatic management, the patient was discharged with Zofran p.r.n.. On 8/2 the patient returned to the ED with same symptoms of N/V/D, her PCP had increase the Zofran to 8 mg per dose the patient had been drinking fluids but unable to eat she continued to complain of abdominal discomfort and burning in her throat and chest secondary to emesis, her WBC was 13.3 down from 18.2, her creatinine was elevated to 1.94 no DKA noted CT scan of the abdomen/pelvis without contrast showed no acute findings, the  patient requested to go home did not want to stay as inpatient mother agreed the patient was discharged with compazine for nausea.     Today the patient presents back to the ED with her mother, unable to eat due to N/V/D, burning pain in her chest and throat due to vomiting, complains of cough and SOB denies any contact with anyone who has been sick, although she did stay over a friends house this past Thursday.  Patient did travel to [place], several weeks ago as well.  Majority of the history and timeline of events a given by the patient's mother patient only was point answers at  times or using her fingers to answer questions.  The patient has not been taking her insulin because she has not been able to eat, her mother states they been tracking her glucose and it ranges from 125 to 198. EKG sinus rhythm QTC 447 no ST elevation or depressions noted, the patient admits to smoking marijuana every night, chest x-ray no acute pathology Covid 19 obtain. Labs:  WBC 7.7, H&H 13.4/38.6, NA 140, K 3.7, anion gap 11, BS 152, BUN 37, CR 1.54, CxR no evidence for pneumonia or CHF no acute cardiopulmonary findings V/S T 97.2??, HR 82, R 18, BP 142/86, SaO2 99% RA  Past Medical History:   Diagnosis Date   ??? Diabetes 1.5, managed as type 1 ([place])    ??? Fibromyalgia       Past Surgical History:   Procedure Laterality Date   ??? HX OTHER SURGICAL      mother states no history of surgery      Family History   Problem Relation Age of Onset   ??? Atrial Fibrillation Mother    ??? Other Father       Social History     Tobacco Use   ??? Smoking status: Never Smoker   ??? Smokeless tobacco: Current User   Substance Use Topics   ??? Alcohol use: Not Currently      Prior to Admission medications  Medication Sig Start Date End Date Taking? Authorizing Provider   DULoxetine (CYMBALTA) 60 mg capsule Take 60 mg by mouth daily. 01/16/19  Yes Provider, Historical   insulin glargine (Lantus Solostar U-100 Insulin) 100 unit/mL (3 mL) inpn 22 Units by SubCUTAneous route At bedtime. 11/22/18  Yes Provider, Historical   insulin lispro (HUMALOG) 100 unit/mL kwikpen 10 Units by SubCUTAneous route three (3) times daily (with meals). 11/22/18  Yes Provider, Historical   promethazine (PHENERGAN) 25 mg tablet Take 1 Tab by mouth every six (6) hours as needed for Nausea. 03/24/19  Yes Stefan Church, MD     No Known Allergies     Review of Systems   Constitutional: Positive for activity change, appetite change and chills. Negative for diaphoresis, fatigue and fever.   HENT: Positive for sore throat. Negative for congestion and trouble swallowing.     Eyes: Negative.    Respiratory: Positive for cough. Negative for shortness of breath.    Cardiovascular: Positive for chest pain. Negative for palpitations and leg swelling.   Gastrointestinal: Positive for abdominal pain, diarrhea, nausea and vomiting. Negative for abdominal distention.   Genitourinary: Negative.  Negative for dysuria and urgency.   Musculoskeletal: Positive for myalgias. Negative for gait problem, neck pain and neck stiffness.   Skin: Negative.    Neurological: Negative.    Hematological: Negative.    Psychiatric/Behavioral: The patient is nervous/anxious.      Objective:     Intake and Output:  No intake/output data recorded.  08/02 0701 - 08/03 1900  In: 2000 [I.V.:2000]  Out: -     Visit Vitals  BP 142/86   Pulse 82   Temp 97.2 ??F (36.2 ??C)   Resp 18   Ht '5\' 8"'  (1.727 m)   Wt 72.6 kg (160 lb)   SpO2 99%   BMI 24.33 kg/m??     Physical Exam  Vitals signs and nursing note reviewed.   Constitutional:       Comments: Only whispering answers to questions, or holding up her fingers to indicate the units of insulin she is taking.   HENT:      Head: Normocephalic and atraumatic.      Nose: Nose normal. No rhinorrhea.      Mouth/Throat:      Pharynx: Oropharynx is clear. No posterior oropharyngeal erythema.   Eyes:      General: No scleral icterus.     Conjunctiva/sclera: Conjunctivae normal.      Pupils: Pupils are equal, round, and reactive to light.   Neck:      Musculoskeletal: Normal range of motion and neck supple. No neck rigidity or muscular tenderness.      Vascular: No carotid bruit.   Cardiovascular:      Rate and Rhythm: Normal rate and regular rhythm.      Pulses: Normal pulses.      Heart sounds: Normal heart sounds.   Pulmonary:      Effort: Pulmonary effort is normal.      Breath sounds: Normal breath sounds.   Abdominal:      General: Bowel sounds are normal. There is no distension.      Palpations: Abdomen is soft.      Tenderness: There is abdominal tenderness.   Musculoskeletal:  Normal range of motion.      Right lower leg: No edema.      Left lower leg: No edema.   Lymphadenopathy:      Cervical: No cervical adenopathy.  Skin:     General: Skin is warm and dry.      Capillary Refill: Capillary refill takes less than 2 seconds.   Neurological:      General: No focal deficit present.      Mental Status: She is alert and oriented to person, place, and time. Mental status is at baseline.   Psychiatric:         Mood and Affect: Mood normal.         Behavior: Behavior normal. Behavior is cooperative.       Data Review:   Recent Results (from the past 24 hour(s))   CBC WITH AUTOMATED DIFF    Collection Time: 03/25/19  3:47 PM   Result Value Ref Range    WBC 7.7 4.0 - 10.0 K/uL    RBC 4.26 3.93 - 5.22 M/uL    HGB 13.4 11.8 - 14.8 g/dL    HCT 38.6 36.0 - 45.0 %    MCV 90.6 80.0 - 95.0 FL    MCH 31.5 25.6 - 32.2 PG    MCHC 34.7 32.2 - 35.5 g/dL    RDW 11.8 11.6 - 14.4 %    PLATELET 264 150 - 400 K/uL    MPV 10.2 9.4 - 12.4 FL    NEUTROPHILS 63 %    LYMPHOCYTES 22 %    MONOCYTES 10 %    EOSINOPHILS 4 %    BASOPHILS 1 0 - 1.2 %    ABS. NEUTROPHILS 4.9 1.6 - 6.1 K/UL    ABS. LYMPHOCYTES 1.7 1.2 - 3.7 K/UL    ABS. MONOCYTES 0.8 0.2 - 0.8 K/UL    ABS. EOSINOPHILS 0.3 0.0 - 0.5 K/UL    ABS. BASOPHILS 0.1 0.0 - 0.1 K/UL    DF AUTOMATED     IMMATURE GRANULOCYTES 0 0.0 - 0.4 %    ABS. IMM. GRANS. 0.0 0.00 - 9.14 K/UL   METABOLIC PANEL, COMPREHENSIVE    Collection Time: 03/25/19  3:47 PM   Result Value Ref Range    Sodium 140 136 - 145 mmol/L    Potassium 3.7 3.5 - 5.1 mmol/L    Chloride 101 98 - 110 mmol/L    CO2 28 21 - 32 mmol/L    Anion gap 11 5 - 15 mmol/L    Glucose 152 (H) 70 - 100 mg/dL    BUN 37 (H) 7 - 18 MG/DL    Creatinine 1.54 (H) 0.55 - 1.02 MG/DL    BUN/Creatinine ratio 24 (H) 12 - 20    GFR est AA 49 (L) >60 ml/min/1.76m    GFR est non-AA 43 (L) >60 ml/min/1.758m   Calcium 8.4 (L) 8.5 - 10.1 MG/DL    Bilirubin, total 0.68 0.20 - 1.20 mg/dL    ALT (SGPT) 31 13 - 56 U/L    AST (SGOT) 22 15 -  37 U/L    Alk. phosphatase 57 45 - 117 U/L    Protein, total 7.8 6.4 - 8.2 g/dL    Albumin 3.8 3.4 - 5.0 g/dL    A-G Ratio 1.0 1.0 - 3.0     SARS-COV-2    Collection Time: 03/25/19  3:47 PM   Result Value Ref Range    Specimen source THROAT     COVID-19 rapid test Not detected Not detected   EKG, 12 LEAD, INITIAL    Collection Time: 03/25/19  4:48 PM   Result Value Ref Range    Heart Rate 84 bpm  RR Interval 714 ms    Atrial Rate 84 ms    P-R Interval 161 ms    P Duration 125 ms    P Horizontal Axis 17 deg    P Front Axis 78 deg    Q Onset 504 ms    QRSD Interval 90 ms    QT Interval 378 ms    QTcB 447 ms    QTcF 423 ms    QRS Horizontal Axis -51 deg    QRS Axis 111 deg    I-40 Horizontal Axis -25 deg    I-40 Front Axis 94 deg    T-40 Horizontal Axis -70 deg    T-40 Front Axis 137 deg    T Horizontal Axis 25 deg    T Wave Axis 62 deg    S-T Horizontal Axis 83 deg    S-T Front Axis 30 deg   UA WITH REFLEX MICRO AND CULTURE    Collection Time: 03/25/19  6:42 PM    Specimen: Urine   Result Value Ref Range    Color YELLOW YELLOW    Appearance CLEAR CLEAR    Specific gravity 1.027 1.001 - 1.035    pH (UA) 6.5 5 - 8    Protein 30 (A) Negative mg/dL    Glucose Negative Negative mg/dL    Ketone >=160 (A) Negative mg/dL    Bilirubin Negative Negative    Blood Negative Negative    Urobilinogen 0.2 0.2 - 1.0 EU/dL    Nitrites Negative Negative    Leukocyte Esterase Negative Negative   URINE MICROSCOPIC WITH REFLEX CULTURE    Collection Time: 03/25/19  6:42 PM    Specimen: Urine   Result Value Ref Range    WBC NONE SEEN <5 /hpf    RBC 0-4 <5 /hpf    Bacteria TRACE (A) NONE SEEN /hpf    UA:UC IF INDICATED CULTURE NOT INDICATED BY UA RESULT     Epithelial cells 0-4  SQUAMOUS   <5 /hpf   ACETONE/KETONE, QL    Collection Time: 03/25/19  7:30 PM   Result Value Ref Range    Acetone/Ketone serum, QL. 20 mg/dL (A) Negative          Xr Chest Sngl V    Result Date: 03/25/2019  : THERE ARE NO ACUTE CARDIOPULMONARY FINDINGS SEEN. J:   [id number] D:  03/25/2019 15:48:05 T:  03/25/2019 15:55:30     Old records reviewed.   Assessment:     Active Problems:    AKI (acute kidney injury) ([place]) (03/25/2019)      Plan:   In Summary Kathryn [name] 28 yo has had N/V/D and abdominal discomfort with burning in her throat and chest for 3 days, symptoms started after she came home from staying over her friend's house Thursday night.    1. AKI in the setting of N/V/D suspect secondary to marijuana use patient smokes every night the patient's mother states it was ordered for her fibromyalgia    ?? Will admit patient as inpatient status  ?? IV normal saline continuous infusion  ?? Tylenol 1000 mg every 8 hours p.r.n. as needed  ?? Protonix 40 mg IV daily  ?? Compazine 10 mg IV while in the ED  ?? Reglan 10 mg IV every 6 hours p.r.n.  ?? Zofran 8 mg every 8 hours p.r.n.  ?? Capsicum 0.25% cream x1  ?? Ativan 1 mg IV x1, then every 4 hours p.r.n. as needed  ?? CBC metabolic panel in  a.m.    2. Diabetes type 1 insulin-dependent, patient has not been taking her insulin, her mother states they have been tracking her BS ranging from 125-198.  ?? Without DKA noted at this time   ?? Lantus 10 units at bedtime  ?? Will place patient on insulin sliding scale sensitive level tier 1 BS a.c. and HS  ?? A1c in a.m.    3. Anxiety continue Cymbalta 60 mg daily    4. DVT prophylaxis bilateral mechanical sequential devices    5. Code status patient is a full code discussed with patient while mother was present    The patient's medications, assessment, labs and plan of care was discussed with Dr. Algie Coffer    This document was generated with the aid of voice recognition software. Please be aware that there may be inadvertent transcription errors not identified or corrected by the author  Signed By: Raylene Everts, NP     March 25, 2019

## 2019-03-25 NOTE — ED Notes (Signed)
Pt ambulatory to and from bathroom without issue. Urine sample collected and sent to lab. Pt initially remained bundled up in blankets, sleeping on stretcher when mother came out to desk saying pt did ok with PO trial. This RN did not see pt sitting up drinking. Pt also reporting she feels nauseous again. Will notify provider.

## 2019-03-25 NOTE — ED Notes (Signed)
Note left for Ronaldo Miyamoto A., PA. Pt's mother requesting additional nausea medication as pt is continuing to vomit.

## 2019-03-25 NOTE — ED Provider Notes (Signed)
This is a 28 year old female patient with a history of diabetes, fibromyalgia presenting today for her 3rd visit in 3 days for abdominal discomfort, diarrhea, nausea, vomiting, weakness.  She has been given Compazine and Zofran in her last 2 visits which she states helps in the ER but does not help her when she gets home.  She was found to have worsening renal functions over the last 48 hours and was offered but declined admission yesterday.  She denies fevers chills or sweats.  She states that she now has a burning pain in her chest as well as a cough and shortness of breath.  She denies sick contacts.  Apparently traveled to [place] a few weeks ago.  Has had a significantly decreased appetite.  No black or bloody stools.  Has not had diarrhea in a couple days.  She had an unremarkable CT scan of her abdomen pelvis yesterday.           Past Medical History:   Diagnosis Date   ??? 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 Fibromyalgia        History reviewed. No pertinent surgical history.      History reviewed. No pertinent family history.    Social History     Socioeconomic History   ??? Marital status: SINGLE     Spouse name: Not on file   ??? Number of children: Not on file   ??? Years of education: Not on file   ??? Highest education level: Not on file   Occupational History   ??? Not on file   Social Needs   ??? Financial resource strain: Not on file   ??? Food insecurity     Worry: Not on file     Inability: Not on file   ??? Transportation needs     Medical: Not on file     Non-medical: Not on file   Tobacco Use   ??? Smoking status: Never Smoker   ??? Smokeless tobacco: Current User   Substance and Sexual Activity   ??? Alcohol use: Not Currently   ??? Drug use: Yes     Types: Marijuana   ??? Sexual activity: Not on file   Lifestyle   ??? Physical activity     Days per week: Not on file     Minutes per session: Not on file   ??? Stress: Not on file   Relationships   ??? Social Wellsite geologistconnections     Talks on phone: Not on file     Gets together:  Not on file     Attends religious service: Not on file     Active member of club or organization: Not on file     Attends meetings of clubs or organizations: Not on file     Relationship status: Not on file   ??? Intimate partner violence     Fear of current or ex partner: Not on file     Emotionally abused: Not on file     Physically abused: Not on file     Forced sexual activity: Not on file   Other Topics Concern   ??? Not on file   Social History Narrative   ??? Not on file         ALLERGIES: Patient has no known allergies.    Review of Systems   Constitutional: Positive for activity change, appetite change and fatigue. Negative for chills, diaphoresis, fever and unexpected weight change.   Respiratory:  Positive for cough and shortness of breath. Negative for wheezing and stridor.    Cardiovascular: Positive for chest pain.   Gastrointestinal: Positive for abdominal pain, diarrhea, nausea and vomiting.   Genitourinary: Negative for decreased urine volume, dysuria, flank pain and urgency.   Musculoskeletal: Negative for arthralgias, joint swelling and myalgias.   Neurological: Positive for dizziness.   Psychiatric/Behavioral: Negative.        Vitals:    03/25/19 1516   BP: (!) 149/104   Pulse: 81   Resp: 18   Temp: 97.2 ??F (36.2 ??C)   SpO2: 99%   Weight: 72.6 kg (160 lb)   Height: 5\' 8"  (1.727 m)            Physical Exam  Vitals signs and nursing note reviewed.   Constitutional:       General: She is not in acute distress.     Appearance: Normal appearance. She is normal weight. She is ill-appearing (Nontoxic). She is not toxic-appearing or diaphoretic.   HENT:      Head: Normocephalic and atraumatic.   Cardiovascular:      Rate and Rhythm: Normal rate and regular rhythm.      Pulses: Normal pulses.      Heart sounds: Normal heart sounds.   Pulmonary:      Effort: Pulmonary effort is normal.      Breath sounds: Normal breath sounds.   Abdominal:      General: Abdomen is flat. Bowel sounds are normal.      Tenderness:  There is abdominal tenderness. There is no guarding or rebound.   Musculoskeletal: Normal range of motion.   Skin:     General: Skin is warm and dry.      Capillary Refill: Capillary refill takes less than 2 seconds.   Neurological:      General: No focal deficit present.      Mental Status: She is alert and oriented to person, place, and time. Mental status is at baseline.   Psychiatric:         Mood and Affect: Mood normal.         Behavior: Behavior normal.         Thought Content: Thought content normal.         Judgment: Judgment normal.          MDM  Number of Diagnoses or Management Options  Diagnosis management comments: This is a pleasant 28 year old female patient presenting with mom for evaluation of about 4 days of weakness, abdominal pain, nausea, vomiting and now about 36 hours of cough and shortness of breath.  On exam she appears ill but nontoxic.  Resting with her eyes closed.  Refusing to answer questions.  Majority of the history was obtained from her mother.  Her abdomen appears diffusely tender without significant rebound or guarding.  Breath sounds are clear and intact throughout.  Vitals are unremarkable.  We will get a COVID-19 swab.  Chest x-ray.  Give fluids.  Zofran.  Re-evaluate.       Amount and/or Complexity of Data Reviewed  Clinical lab tests: reviewed and ordered  Tests in the radiology section of CPT??: reviewed and ordered  Tests in the medicine section of CPT??: ordered and reviewed      ED Course as of Mar 25 1919   Mon Mar 25, 2019   1634 Labs today are actually improved from previous.  Her creatinine has improved from 1.9-1.5.  There is no elevated CO2 or  anion gap.  No evidence of DKA or HHS.  Her urinalysis is pending.  Her chest x-ray does not show any acute pathology.  COVID-19 swab is pending.  We will give her some additional Compazine and Toradol.    [KA]   1653 EKG today shows a sinus rhythm at 84 beats per minute.  QTC 447. No obvious ST elevations or depressions.   T-wave inversions in V1.  No signs of acute infarct or ischemia.    [KA]   1709 On re-evaluation patient reports that she does smoke marijuana every night.  It is quite possible that this could be cyclic vomiting syndrome.  She will be given a 2nd L of fluids.  We will give Haldol.  Will reassess.    [KA]   1752 Patient is sleeping but is easily arousable.  She feels much improved after Haldol.    [KA]   1901 Patient is reporting continued nausea.  She will be given capsaicin.  She was able to tolerate oral fluids.    [KA]   1914 She has greater than 160 ketones in her urine.  She is going to be admitted to the hospital for her hyper emesis and dehydration.  She is agreeable with this.    [KA]      ED Course User Index  [KA] Grant Ruts, PA       Procedures      NIH Stroke Scale

## 2019-03-25 NOTE — ED Notes (Signed)
Pt remains lying on back on stretcher with HOB elevated, appears to feel unwell. Assessment grossly unchanged. Hospitalist MD and NP at bedside. Pt medicated per provider orders. Pt requesting to not do send out Covid test at this time due to nausea. Mother remains at bedside and call light within reach.

## 2019-03-25 NOTE — ED Notes (Signed)
Cup of ice water placed at bedside for PO trial.

## 2019-03-25 NOTE — ED Notes (Signed)
Call placed to pharmacy requesting capsicum cream be verified and sent to ED. Provider also to bedside at this time.

## 2019-03-25 NOTE — ED Notes (Signed)
Patient returns to the ED with continued nausea and vomiting for about 3 days now. This is her 3rd visit in 3 days for the same symptoms. She did also have diarrhea for the first 24 hours of the illness, but that had resolved and mom states that her diarrhea returned today. Denies fever or chills at this time. Blood sugars have been stable she has a dexcom. The violent vomiting has caused her to not being able to keep anything down. The medications she was given hasnt been helping.

## 2019-03-26 LAB — COMPREHENSIVE METABOLIC PANEL
ALT: 23 U/L (ref 13–56)
AST: 16 U/L (ref 15–37)
Albumin/Globulin Ratio: 0.8 — ABNORMAL LOW (ref 1.0–3.0)
Albumin: 2.9 g/dL — ABNORMAL LOW (ref 3.4–5.0)
Alkaline Phosphatase: 48 U/L (ref 45–117)
Anion Gap: 9 mmol/L (ref 5–15)
BUN: 34 MG/DL — ABNORMAL HIGH (ref 7–18)
Bun/Cre Ratio: 26 NA — ABNORMAL HIGH (ref 12–20)
CO2: 25 mmol/L (ref 21–32)
Calcium: 7.5 MG/DL — ABNORMAL LOW (ref 8.5–10.1)
Chloride: 107 mmol/L (ref 98–110)
Creatinine: 1.33 MG/DL — ABNORMAL HIGH (ref 0.55–1.02)
EGFR IF NonAfrican American: 51 mL/min/{1.73_m2} — ABNORMAL LOW (ref >60)
GFR African American: 58 mL/min/{1.73_m2} — ABNORMAL LOW (ref >60)
Glucose: 107 mg/dL — ABNORMAL HIGH (ref 70–100)
Potassium: 3.6 mmol/L (ref 3.5–5.1)
Sodium: 141 mmol/L (ref 136–145)
Total Bilirubin: 0.5 mg/dL (ref 0.20–1.20)
Total Protein: 6.5 g/dL (ref 6.4–8.2)

## 2019-03-26 LAB — CBC WITH AUTO DIFFERENTIAL
Basophils %: 0 % (ref 0–1.2)
Basophils Absolute: 0 10*3/uL (ref 0.0–0.1)
Eosinophils %: 2 %
Eosinophils Absolute: 0.1 10*3/uL (ref 0.0–0.5)
Granulocyte Absolute Count: 0 10*3/uL (ref 0.00–0.03)
Hematocrit: 33.3 % — ABNORMAL LOW (ref 36.0–45.0)
Hemoglobin: 11.3 g/dL — ABNORMAL LOW (ref 11.8–14.8)
Immature Granulocytes: 0 % (ref 0.0–0.4)
Lymphocytes %: 31 %
Lymphocytes Absolute: 2.5 10*3/uL (ref 1.2–3.7)
MCH: 31 PG (ref 25.6–32.2)
MCHC: 33.9 g/dL (ref 32.2–35.5)
MCV: 91.2 FL (ref 80.0–95.0)
MPV: 9.8 FL (ref 9.4–12.4)
Monocytes %: 10 %
Monocytes Absolute: 0.8 10*3/uL (ref 0.2–0.8)
Neutrophils %: 57 %
Neutrophils Absolute: 4.6 10*3/uL (ref 1.6–6.1)
Platelets: 248 10*3/uL (ref 150–400)
RBC: 3.65 M/uL — ABNORMAL LOW (ref 3.93–5.22)
RDW: 11.7 % (ref 11.6–14.4)
WBC: 8.1 10*3/uL (ref 4.0–10.0)

## 2019-03-26 LAB — EKG 12-LEAD
Atrial Rate: 84 ms
ECG QTC Interval: 376 ms
EKG I-40 FRONT AXIS: 90 deg
EKG I-40 HORIZONTAL AXIS: -27 deg
EKG P DURATION: 122 ms
EKG P FRONT AXIS: 77 deg
EKG P HORIZONTAL AXIS: 19 deg
EKG Q ONSET: 501 ms
EKG QRS AXIS: 111 deg
EKG QRS HORIZONTAL AXIS: -50 deg
EKG QRSD INTERVAL: 82 ms
EKG QTCB: 444 ms
EKG QTCF: 420 ms
EKG RR INTERVAL: 716 ms
EKG S-T FRONT AXIS: 32 deg
EKG S-T HORIZONTAL AXIS: 74 deg
EKG T HORIZONTAL AXIS: 25 deg
EKG T WAVE AXIS: 59 deg
EKG T-40 FRONT AXIS: 149 deg
EKG T-40 HORIZONTAL AXIS: -75 deg
Heart Rate: 84 {beats}/min
P-R Interval: 162 ms

## 2019-03-26 LAB — POCT GLUCOSE
POC Glucose: 151 mg/dL — ABNORMAL HIGH (ref 70–105)
POC Glucose: 109 mg/dL — ABNORMAL HIGH (ref 70–105)
POC Glucose: 292 mg/dL — ABNORMAL HIGH (ref 70–105)
POC Glucose: 216 mg/dL — ABNORMAL HIGH (ref 70–105)

## 2019-03-26 LAB — EKG, 12 LEAD, INITIAL
Atrial Rate: 84 ms
Heart Rate: 84 {beats}/min
I-40 Front Axis: 90 deg
I-40 Horizontal Axis: -27 deg
P Duration: 122 ms
P Front Axis: 77 deg
P Horizontal Axis: 19 deg
P-R Interval: 162 ms
Q Onset: 501 ms
QRS Axis: 111 deg
QRS Horizontal Axis: -50 deg
QRSD Interval: 82 ms
QT Interval: 376 ms
QTcB: 444 ms
QTcF: 420 ms
RR Interval: 716 ms
S-T Front Axis: 32 deg
S-T Horizontal Axis: 74 deg
T Horizontal Axis: 25 deg
T Wave Axis: 59 deg
T-40 Front Axis: 149 deg
T-40 Horizontal Axis: -75 deg

## 2019-03-26 LAB — CBC WITH AUTOMATED DIFF
ABS. BASOPHILS: 0 10*3/uL (ref 0.0–0.1)
ABS. EOSINOPHILS: 0.1 10*3/uL (ref 0.0–0.5)
ABS. IMM. GRANS.: 0 10*3/uL (ref 0.00–0.03)
ABS. LYMPHOCYTES: 2.5 10*3/uL (ref 1.2–3.7)
ABS. MONOCYTES: 0.8 10*3/uL (ref 0.2–0.8)
ABS. NEUTROPHILS: 4.6 10*3/uL (ref 1.6–6.1)
BASOPHILS: 0 % (ref 0–1.2)
EOSINOPHILS: 2 %
HCT: 33.3 % — ABNORMAL LOW (ref 36.0–45.0)
HGB: 11.3 g/dL — ABNORMAL LOW (ref 11.8–14.8)
IMMATURE GRANULOCYTES: 0 % (ref 0.0–0.4)
LYMPHOCYTES: 31 %
MCH: 31 PG (ref 25.6–32.2)
MCHC: 33.9 g/dL (ref 32.2–35.5)
MCV: 91.2 FL (ref 80.0–95.0)
MONOCYTES: 10 %
MPV: 9.8 FL (ref 9.4–12.4)
NEUTROPHILS: 57 %
PLATELET: 248 10*3/uL (ref 150–400)
RBC: 3.65 M/uL — ABNORMAL LOW (ref 3.93–5.22)
RDW: 11.7 % (ref 11.6–14.4)
WBC: 8.1 10*3/uL (ref 4.0–10.0)

## 2019-03-26 LAB — METABOLIC PANEL, COMPREHENSIVE
A-G Ratio: 0.8 — ABNORMAL LOW (ref 1.0–3.0)
ALT (SGPT): 23 U/L (ref 13–56)
AST (SGOT): 16 U/L (ref 15–37)
Albumin: 2.9 g/dL — ABNORMAL LOW (ref 3.4–5.0)
Alk. phosphatase: 48 U/L (ref 45–117)
Anion gap: 9 mmol/L (ref 5–15)
BUN/Creatinine ratio: 26 — ABNORMAL HIGH (ref 12–20)
BUN: 34 MG/DL — ABNORMAL HIGH (ref 7–18)
Bilirubin, total: 0.5 mg/dL (ref 0.20–1.20)
CO2: 25 mmol/L (ref 21–32)
Calcium: 7.5 MG/DL — ABNORMAL LOW (ref 8.5–10.1)
Chloride: 107 mmol/L (ref 98–110)
Creatinine: 1.33 MG/DL — ABNORMAL HIGH (ref 0.55–1.02)
GFR est AA: 58 mL/min/{1.73_m2} — ABNORMAL LOW (ref >60)
GFR est non-AA: 51 mL/min/{1.73_m2} — ABNORMAL LOW (ref >60)
Glucose: 107 mg/dL — ABNORMAL HIGH (ref 70–100)
Potassium: 3.6 mmol/L (ref 3.5–5.1)
Protein, total: 6.5 g/dL (ref 6.4–8.2)
Sodium: 141 mmol/L (ref 136–145)

## 2019-03-26 LAB — GLUCOSE, POC
Glucose (POC): 151 mg/dL — ABNORMAL HIGH (ref 70–105)
Glucose (POC): 109 mg/dL — ABNORMAL HIGH (ref 70–105)
Glucose (POC): 292 mg/dL — ABNORMAL HIGH (ref 70–105)
Glucose (POC): 216 mg/dL — ABNORMAL HIGH (ref 70–105)

## 2019-03-26 MED ORDER — ONDANSETRON 4 MG TAB, RAPID DISSOLVE
4 mg | Freq: Three times a day (TID) | ORAL | Status: DC | PRN
Start: 2019-03-26 — End: 2019-03-27

## 2019-03-26 MED ORDER — PANTOPRAZOLE 40 MG TAB, DELAYED RELEASE
40 mg | Freq: Every day | ORAL | Status: DC
Start: 2019-03-26 — End: 2019-03-27
  Administered 2019-03-27: 10:00:00 via ORAL

## 2019-03-26 MED ORDER — GLUCAGON 1 MG INJECTION
1 mg | INTRAMUSCULAR | Status: DC | PRN
Start: 2019-03-26 — End: 2019-03-27

## 2019-03-26 MED ORDER — ACETAMINOPHEN 500 MG TAB
500 mg | Freq: Three times a day (TID) | ORAL | Status: DC | PRN
Start: 2019-03-26 — End: 2019-03-27

## 2019-03-26 MED ORDER — SODIUM CHLORIDE 0.9 % IV
INTRAVENOUS | Status: DC
Start: 2019-03-26 — End: 2019-03-27
  Administered 2019-03-26 – 2019-03-27 (×5): via INTRAVENOUS

## 2019-03-26 MED ORDER — LORAZEPAM 2 MG/ML IJ SOLN
2 mg/mL | INTRAMUSCULAR | Status: DC | PRN
Start: 2019-03-26 — End: 2019-03-27
  Administered 2019-03-26 – 2019-03-27 (×3): via INTRAVENOUS

## 2019-03-26 MED ORDER — GLUCOSE 4 GRAM CHEWABLE TAB
4 gram | ORAL | Status: DC | PRN
Start: 2019-03-26 — End: 2019-03-27

## 2019-03-26 MED ORDER — DEXTROSE 10% IN WATER (D10W) IV
10 % | INTRAVENOUS | Status: DC | PRN
Start: 2019-03-26 — End: 2019-03-27

## 2019-03-26 MED ORDER — DEXTROSE 40 % ORAL GEL
40 % | ORAL | Status: DC | PRN
Start: 2019-03-26 — End: 2019-03-27

## 2019-03-26 MED ORDER — INSULIN ASPART 100 UNIT/ML (3 ML) SUB-Q PEN
100 unit/mL (3 mL) | Freq: Four times a day (QID) | SUBCUTANEOUS | Status: DC
Start: 2019-03-26 — End: 2019-03-27
  Administered 2019-03-26 – 2019-03-27 (×5): via SUBCUTANEOUS

## 2019-03-26 MED ORDER — DULOXETINE 30 MG CAP, DELAYED RELEASE
30 mg | Freq: Every day | ORAL | Status: DC
Start: 2019-03-26 — End: 2019-03-27
  Administered 2019-03-26 – 2019-03-27 (×2): via ORAL

## 2019-03-26 MED ORDER — METOCLOPRAMIDE 5 MG/ML IJ SOLN
5 mg/mL | Freq: Four times a day (QID) | INTRAMUSCULAR | Status: DC
Start: 2019-03-26 — End: 2019-03-27
  Administered 2019-03-26 – 2019-03-27 (×6): via INTRAVENOUS

## 2019-03-26 MED ORDER — INSULIN GLARGINE 100 UNIT/ML (3 ML) SUB-Q PEN
100 unit/mL (3 mL) | Freq: Every evening | SUBCUTANEOUS | Status: DC
Start: 2019-03-26 — End: 2019-03-27
  Administered 2019-03-26 – 2019-03-27 (×2): via SUBCUTANEOUS

## 2019-03-26 MED ORDER — OXYCODONE-ACETAMINOPHEN 5 MG-325 MG TAB
5-325 mg | Freq: Two times a day (BID) | ORAL | Status: DC | PRN
Start: 2019-03-26 — End: 2019-03-27
  Administered 2019-03-26 – 2019-03-27 (×3): via ORAL

## 2019-03-26 MED FILL — DEXTROSE 10% IN WATER (D10W) IV: 10 % | INTRAVENOUS | Qty: 500

## 2019-03-26 MED FILL — METOCLOPRAMIDE 5 MG/ML IJ SOLN: 5 mg/mL | INTRAMUSCULAR | Qty: 2

## 2019-03-26 MED FILL — OXYCODONE-ACETAMINOPHEN 5 MG-325 MG TAB: 5-325 mg | ORAL | Qty: 1

## 2019-03-26 MED FILL — LORAZEPAM 2 MG/ML IJ SOLN: 2 mg/mL | INTRAMUSCULAR | Qty: 1

## 2019-03-26 MED FILL — PROTONIX 40 MG INTRAVENOUS SOLUTION: 40 mg | INTRAVENOUS | Qty: 40

## 2019-03-26 MED FILL — SODIUM CHLORIDE 0.9 % IV: INTRAVENOUS | Qty: 1000

## 2019-03-26 MED FILL — DULOXETINE 30 MG CAP, DELAYED RELEASE: 30 mg | ORAL | Qty: 2

## 2019-03-26 MED FILL — GLUCOSE 4 GRAM CHEWABLE TAB: 4 gram | ORAL | Qty: 4

## 2019-03-26 MED FILL — LANTUS SOLOSTAR U-100 INSULIN 100 UNIT/ML (3 ML) SUBCUTANEOUS PEN: 100 unit/mL (3 mL) | SUBCUTANEOUS | Qty: 3

## 2019-03-26 MED FILL — NOVOLOG FLEXPEN U-100 INSULIN ASPART 100 UNIT/ML (3 ML) SUBCUTANEOUS: 100 unit/mL (3 mL) | SUBCUTANEOUS | Qty: 3

## 2019-03-26 NOTE — Progress Notes (Signed)
General Daily Progress Note    Admit Date: 03/25/2019      Subjective:   I evaluated the patient with RN Fleeta Emmer at 8:35 a.m.Marland Kitchen  Patient complained of whole body ache secondary to fibromyalgia.  Nausea is improving.  She had last vomiting and diarrhea yesterday.  Denied any abdominal pain.  No central chest pain, no palpitation.  Denied dysuria.  COVID-19 lab report is pending.  Medication side effects: none    Current Facility-Administered Medications   Medication Dose Route Frequency   ??? oxyCODONE-acetaminophen (PERCOCET) 5-325 mg per tablet 1 Tab  1 Tab Oral BID PRN   ??? DULoxetine (CYMBALTA) capsule 60 mg  60 mg Oral DAILY   ??? insulin glargine (LANTUS,BASAGLAR) pen 10 Units  10 Units SubCUTAneous QHS   ??? 0.9% sodium chloride infusion  200 mL/hr IntraVENous CONTINUOUS   ??? acetaminophen (TYLENOL) tablet 1,000 mg  1,000 mg Oral Q8H PRN   ??? metoclopramide HCl (REGLAN) injection 10 mg  10 mg IntraVENous Q6H   ??? ondansetron (ZOFRAN ODT) tablet 8 mg  8 mg Oral Q8H PRN   ??? insulin aspart U-100 (NOVOLOG) pen   SubCUTAneous AC&HS   ??? glucose chewable tablet 16 g  16 g Oral PRN   ??? dextrose 40% (GLUTOSE) oral gel 1 Tube  1 Tube Oral PRN   ??? glucagon (GLUCAGEN) injection 1 mg  1 mg IntraVENous PRN   ??? dextrose 10% infusion 250 mL  250 mL IntraVENous PRN   ??? LORazepam (ATIVAN) injection 1 mg  1 mg IntraVENous Q4H PRN   ??? pantoprazole (PROTONIX) 40 mg in 0.9% sodium chloride 10 mL injection  40 mg IntraVENous DAILY            Objective:     Patient Vitals for the past 8 hrs:   BP Temp Pulse Resp SpO2   03/26/19 0856 138/80 98.2 ??F (36.8 ??C) 88 16 96 %     No intake/output data recorded.  08/02 1901 - 08/04 0700  In: 2500 [P.O.:500; I.V.:2000]  Out: -     Physical Exam:   Visit Vitals  BP 138/80 (BP 1 Location: Right arm, BP Patient Position: At rest;Supine)   Pulse 88   Temp 98.2 ??F (36.8 ??C)   Resp 16   Ht '5\' 8"'  (1.727 m)   Wt 72.6 kg (160 lb)   SpO2 96%   BMI 24.33 kg/m??      General:  Alert, cooperative, no distress, appears stated age.  Oriented x3.   Head:  Normocephalic, without obvious abnormality, atraumatic.   Eyes:  Conjunctivae/corneas clear. PERRL, EOMs intact. Fundi benign.   Ears:  Normal TMs and external ear canals both ears.   Nose: Nares normal. Septum midline. Mucosa normal. No drainage or sinus tenderness.   Throat: Lips, mucosa, and tongue normal. Teeth and gums normal.   Neck: Supple, symmetrical, trachea midline, no adenopathy, thyroid: no enlargement/tenderness/nodules, no carotid bruit and no JVD.   Back:   Symmetric, no curvature. ROM normal. No CVA tenderness.   Lungs:   Clear to auscultation bilaterally.  No wheezing, no rales   Chest wall:  No tenderness or deformity.   Heart:  Regular rate and rhythm, S1, S2 normal, no murmur, click, rub or gallop.   Breast Exam:  No tenderness, masses, or nipple abnormality as per patient.  Not examined.   Abdomen:   Soft, non-tender. Bowel sounds normal. No masses,  No organomegaly.   Genitalia:  Normal female without lesion, discharge or  tenderness as per patient.  Not examined   Rectal:  Normal tone,  no masses or tenderness as per patient.  Not examined   Extremities: Extremities normal, atraumatic, no cyanosis or edema.   Pulses: 2+ and symmetric all extremities.   Skin: Skin color, texture, turgor normal. No rashes or lesions.  No pressure ulcer.  RN Fleeta Emmer checked and confirmed   Lymph nodes: Cervical, supraclavicular, and axillary nodes normal.   Neurologic: CNII-XII intact. Normal strength, sensation and reflexes throughout.  Peripheral nervous system:  Sensory and motor are intact.           Data Review   Xr Chest Sngl V    Result Date: 03/25/2019  : THERE ARE NO ACUTE CARDIOPULMONARY FINDINGS SEEN. J:  [id number] D:  03/25/2019 15:48:05 T:  03/25/2019 15:55:30       Echo Results  (Last 48 hours)    None          Recent Results (from the past 24 hour(s))   CBC WITH AUTOMATED DIFF    Collection Time: 03/25/19  3:47 PM    Result Value Ref Range    WBC 7.7 4.0 - 10.0 K/uL    RBC 4.26 3.93 - 5.22 M/uL    HGB 13.4 11.8 - 14.8 g/dL    HCT 38.6 36.0 - 45.0 %    MCV 90.6 80.0 - 95.0 FL    MCH 31.5 25.6 - 32.2 PG    MCHC 34.7 32.2 - 35.5 g/dL    RDW 11.8 11.6 - 14.4 %    PLATELET 264 150 - 400 K/uL    MPV 10.2 9.4 - 12.4 FL    NEUTROPHILS 63 %    LYMPHOCYTES 22 %    MONOCYTES 10 %    EOSINOPHILS 4 %    BASOPHILS 1 0 - 1.2 %    ABS. NEUTROPHILS 4.9 1.6 - 6.1 K/UL    ABS. LYMPHOCYTES 1.7 1.2 - 3.7 K/UL    ABS. MONOCYTES 0.8 0.2 - 0.8 K/UL    ABS. EOSINOPHILS 0.3 0.0 - 0.5 K/UL    ABS. BASOPHILS 0.1 0.0 - 0.1 K/UL    DF AUTOMATED     IMMATURE GRANULOCYTES 0 0.0 - 0.4 %    ABS. IMM. GRANS. 0.0 0.00 - 1.06 K/UL   METABOLIC PANEL, COMPREHENSIVE    Collection Time: 03/25/19  3:47 PM   Result Value Ref Range    Sodium 140 136 - 145 mmol/L    Potassium 3.7 3.5 - 5.1 mmol/L    Chloride 101 98 - 110 mmol/L    CO2 28 21 - 32 mmol/L    Anion gap 11 5 - 15 mmol/L    Glucose 152 (H) 70 - 100 mg/dL    BUN 37 (H) 7 - 18 MG/DL    Creatinine 1.54 (H) 0.55 - 1.02 MG/DL    BUN/Creatinine ratio 24 (H) 12 - 20    GFR est AA 49 (L) >60 ml/min/1.65m    GFR est non-AA 43 (L) >60 ml/min/1.760m   Calcium 8.4 (L) 8.5 - 10.1 MG/DL    Bilirubin, total 0.68 0.20 - 1.20 mg/dL    ALT (SGPT) 31 13 - 56 U/L    AST (SGOT) 22 15 - 37 U/L    Alk. phosphatase 57 45 - 117 U/L    Protein, total 7.8 6.4 - 8.2 g/dL    Albumin 3.8 3.4 - 5.0 g/dL    A-G Ratio 1.0 1.0 - 3.0  SARS-COV-2    Collection Time: 03/25/19  3:47 PM   Result Value Ref Range    Specimen source THROAT     COVID-19 rapid test Not detected Not detected   EKG, 12 LEAD, INITIAL    Collection Time: 03/25/19  4:48 PM   Result Value Ref Range    Heart Rate 84 bpm    RR Interval 716 ms    Atrial Rate 84 ms    P-R Interval 162 ms    P Duration 122 ms    P Horizontal Axis 19 deg    P Front Axis 77 deg    Q Onset 501 ms    QRSD Interval 82 ms    QT Interval 376 ms    QTcB 444 ms    QTcF 420 ms     QRS Horizontal Axis -50 deg    QRS Axis 111 deg    I-40 Horizontal Axis -27 deg    I-40 Front Axis 90 deg    T-40 Horizontal Axis -75 deg    T-40 Front Axis 149 deg    T Horizontal Axis 25 deg    T Wave Axis 59 deg    S-T Horizontal Axis 74 deg    S-T Front Axis 32 deg   UA WITH REFLEX MICRO AND CULTURE    Collection Time: 03/25/19  6:42 PM    Specimen: Urine   Result Value Ref Range    Color YELLOW YELLOW    Appearance CLEAR CLEAR    Specific gravity 1.027 1.001 - 1.035    pH (UA) 6.5 5 - 8    Protein 30 (A) Negative mg/dL    Glucose Negative Negative mg/dL    Ketone >=160 (A) Negative mg/dL    Bilirubin Negative Negative    Blood Negative Negative    Urobilinogen 0.2 0.2 - 1.0 EU/dL    Nitrites Negative Negative    Leukocyte Esterase Negative Negative   URINE MICROSCOPIC WITH REFLEX CULTURE    Collection Time: 03/25/19  6:42 PM    Specimen: Urine   Result Value Ref Range    WBC NONE SEEN <5 /hpf    RBC 0-4 <5 /hpf    Bacteria TRACE (A) NONE SEEN /hpf    UA:UC IF INDICATED CULTURE NOT INDICATED BY UA RESULT     Epithelial cells 0-4  SQUAMOUS   <5 /hpf   ACETONE/KETONE, QL    Collection Time: 03/25/19  7:30 PM   Result Value Ref Range    Acetone/Ketone serum, QL. 20 mg/dL (A) Negative        GLUCOSE, POC    Collection Time: 03/25/19 11:55 PM   Result Value Ref Range    Glucose (POC) 151 (H) 70 - 105 mg/dL    Performed by Bing Ree    SARS-COV-2    Collection Time: 03/26/19 12:26 AM   Result Value Ref Range    Specimen source THROAT SWAB     Category PENDING     CRSP SARS-CoV-2, PCR PENDING    CBC WITH AUTOMATED DIFF    Collection Time: 03/26/19  5:54 AM   Result Value Ref Range    WBC 8.1 4.0 - 10.0 K/uL    RBC 3.65 (L) 3.93 - 5.22 M/uL    HGB 11.3 (L) 11.8 - 14.8 g/dL    HCT 33.3 (L) 36.0 - 45.0 %    MCV 91.2 80.0 - 95.0 FL    MCH 31.0 25.6 - 32.2 PG  MCHC 33.9 32.2 - 35.5 g/dL    RDW 11.7 11.6 - 14.4 %    PLATELET 248 150 - 400 K/uL    MPV 9.8 9.4 - 12.4 FL    NEUTROPHILS 57 %    LYMPHOCYTES 31 %     MONOCYTES 10 %    EOSINOPHILS 2 %    BASOPHILS 0 0 - 1.2 %    ABS. NEUTROPHILS 4.6 1.6 - 6.1 K/UL    ABS. LYMPHOCYTES 2.5 1.2 - 3.7 K/UL    ABS. MONOCYTES 0.8 0.2 - 0.8 K/UL    ABS. EOSINOPHILS 0.1 0.0 - 0.5 K/UL    ABS. BASOPHILS 0.0 0.0 - 0.1 K/UL    DF AUTOMATED     IMMATURE GRANULOCYTES 0 0.0 - 0.4 %    ABS. IMM. GRANS. 0.0 0.00 - 8.11 K/UL   METABOLIC PANEL, COMPREHENSIVE    Collection Time: 03/26/19  5:54 AM   Result Value Ref Range    Sodium 141 136 - 145 mmol/L    Potassium 3.6 3.5 - 5.1 mmol/L    Chloride 107 98 - 110 mmol/L    CO2 25 21 - 32 mmol/L    Anion gap 9 5 - 15 mmol/L    Glucose 107 (H) 70 - 100 mg/dL    BUN 34 (H) 7 - 18 MG/DL    Creatinine 1.33 (H) 0.55 - 1.02 MG/DL    BUN/Creatinine ratio 26 (H) 12 - 20    GFR est AA 58 (L) >60 ml/min/1.53m    GFR est non-AA 51 (L) >60 ml/min/1.759m   Calcium 7.5 (L) 8.5 - 10.1 MG/DL    Bilirubin, total 0.50 0.20 - 1.20 mg/dL    ALT (SGPT) 23 13 - 56 U/L    AST (SGOT) 16 15 - 37 U/L    Alk. phosphatase 48 45 - 117 U/L    Protein, total 6.5 6.4 - 8.2 g/dL    Albumin 2.9 (L) 3.4 - 5.0 g/dL    A-G Ratio 0.8 (L) 1.0 - 3.0     GLUCOSE, POC    Collection Time: 03/26/19  7:39 AM   Result Value Ref Range    Glucose (POC) 109 (H) 70 - 105 mg/dL    Performed by TyCeline Mans  GLUCOSE, POC    Collection Time: 03/26/19 11:00 AM   Result Value Ref Range    Glucose (POC) 292 (H) 70 - 105 mg/dL    Performed by AlConstance Holster      All Micro Results     None          XR CHEST SNGL V   Final Result   :   THERE ARE NO ACUTE CARDIOPULMONARY FINDINGS SEEN.         J: :  [id number] D:  03/25/2019 15:48:05   T:  03/25/2019 15:55:30                                     CT Results  (Last 48 hours)    None          * No surgery found *          Assessment:     Principal Problem:    [place]) ([place])    Active Problems:    [place]ute renal [place] ([place]F) [place]  Plan:     1. Acute renal failure secondary to dehydration, prerenal, possible due to  nausea vomiting, diarrhea.  Renal failure is improving.  Continue 0.9% sodium chloride at 100 mL/hour.  Patient is started with full liquid diet and will be advanced with diabetic diet as tolerated.  IV Protonix is changed to p.o. Protonix 40 mg daily.    2. Diabetes mellitus, type 1. No anion gap.  Continue sliding scale insulin protocol.  Will check hemoglobin A1c to understand the status of control of diabetes mellitus.     3. Fibromyalgia.  Patient is complaining significant whole body ache.  She informed that Vicodin works for her as needed.  She is started with Percocet 5/325 mg 1 tablet p.o. 2 times daily p.r.n. pain for 2 days.      4. Deep vein thrombosis prophylaxis with bilateral leg compression device.    Palliative care management plan discussion:  I discussed with the patient about palliative care management plan, advanced directive and code status for which I spent separately 16 minutes.  The patient confirmed full code status.    Care Plan discussed with:   '[x]' Patient   '[]' Family    '[x]' Care Manager    '[x]' Nursing   '[]' Consultant/Specialist :     Disposition:  Possible discharge tomorrow if improves of acute renal failure can tolerate p.o. food      This document was generated with the aid of voice recognition software.. Please be aware that there may be inadvertent transcription errors not identified and corrected by the Judeen Hammans, MD  03/26/2019, 2:25 PM

## 2019-03-26 NOTE — Progress Notes (Signed)
Pt reported to this RN that she has not voided since 1800 on 8/3. Pt still receiving 200ml/hr NS and switched to a full liquid diet, able to keep everything down including clears. Bladder scanned pt at 1115 and 2ml were shown after 3 attempts of scanning. Dr. Sinha made aware, instructed to continue IVF

## 2019-03-26 NOTE — Progress Notes (Signed)
Problem: Falls - Risk of  Goal: *Absence of Falls  Description: Document Schmid Fall Risk and appropriate interventions in the flowsheet.  Outcome: Progressing Towards Goal  Note: Fall Risk Interventions:            Medication Interventions: Teach patient to arise slowly                   Problem: Patient Education: Go to Patient Education Activity  Goal: Patient/Family Education  Outcome: Progressing Towards Goal

## 2019-03-26 NOTE — Progress Notes (Signed)
Problem: Falls - Risk of  Goal: *Absence of Falls  Description: Document Schmid Fall Risk and appropriate interventions in the flowsheet.  Outcome: Progressing Towards Goal  Note: Fall Risk Interventions:            Medication Interventions: Patient to call before getting OOB, Teach patient to arise slowly                   Problem: Patient Education: Go to Patient Education Activity  Goal: Patient/Family Education  Outcome: Progressing Towards Goal

## 2019-03-26 NOTE — Progress Notes (Signed)
Problem: Falls - Risk of  Goal: *Absence of Falls  Description: Document Schmid Fall Risk and appropriate interventions in the flowsheet.  Outcome: Progressing Towards Goal  Note: Fall Risk Interventions:            Medication Interventions: Teach patient to arise slowly                   Problem: Patient Education: Go to Patient Education Activity  Goal: Patient/Family Education  Outcome: Progressing Towards Goal     Problem: Nausea/Vomiting (Adult)  Goal: *Absence of nausea/vomiting  Outcome: Progressing Towards Goal  Goal: *Palliation of nausea/vomiting (Palliative Care)  Outcome: Progressing Towards Goal     Problem: Patient Education: Go to Patient Education Activity  Goal: Patient/Family Education  Outcome: Progressing Towards Goal

## 2019-03-26 NOTE — Progress Notes (Signed)
Problem: Falls - Risk of  Goal: *Absence of Falls  Description: Document Schmid Fall Risk and appropriate interventions in the flowsheet.  Outcome: Progressing Towards Goal  Note: Fall Risk Interventions:            Medication Interventions: Patient to call before getting OOB, Teach patient to arise slowly                   Problem: Patient Education: Go to Patient Education Activity  Goal: Patient/Family Education  Outcome: Progressing Towards Goal

## 2019-03-26 NOTE — Progress Notes (Signed)
Problem: Falls - Risk of  Goal: *Absence of Falls  Description: Document Schmid Fall Risk and appropriate interventions in the flowsheet.  Outcome: Progressing Towards Goal  Note: Fall Risk Interventions:            Medication Interventions: Teach patient to arise slowly                   Problem: Patient Education: Go to Patient Education Activity  Goal: Patient/Family Education  Outcome: Progressing Towards Goal

## 2019-03-26 NOTE — Progress Notes (Signed)
General Daily Progress Note    Admit Date: 03/25/2019      Subjective:   I evaluated the patient with RN Fleeta Emmer at 8:35 a.m.Marland Kitchen  Patient complained of whole body ache secondary to fibromyalgia.  Nausea is improving.  She had last vomiting and diarrhea yesterday.  Denied any abdominal pain.  No central chest pain, no palpitation.  Denied dysuria.  COVID-19 lab report is pending.  Medication side effects: none    Current Facility-Administered Medications   Medication Dose Route Frequency   ??? oxyCODONE-acetaminophen (PERCOCET) 5-325 mg per tablet 1 Tab  1 Tab Oral BID PRN   ??? DULoxetine (CYMBALTA) capsule 60 mg  60 mg Oral DAILY   ??? insulin glargine (LANTUS,BASAGLAR) pen 10 Units  10 Units SubCUTAneous QHS   ??? 0.9% sodium chloride infusion  200 mL/hr IntraVENous CONTINUOUS   ??? acetaminophen (TYLENOL) tablet 1,000 mg  1,000 mg Oral Q8H PRN   ??? metoclopramide HCl (REGLAN) injection 10 mg  10 mg IntraVENous Q6H   ??? ondansetron (ZOFRAN ODT) tablet 8 mg  8 mg Oral Q8H PRN   ??? insulin aspart U-100 (NOVOLOG) pen   SubCUTAneous AC&HS   ??? glucose chewable tablet 16 g  16 g Oral PRN   ??? dextrose 40% (GLUTOSE) oral gel 1 Tube  1 Tube Oral PRN   ??? glucagon (GLUCAGEN) injection 1 mg  1 mg IntraVENous PRN   ??? dextrose 10% infusion 250 mL  250 mL IntraVENous PRN   ??? LORazepam (ATIVAN) injection 1 mg  1 mg IntraVENous Q4H PRN   ??? pantoprazole (PROTONIX) 40 mg in 0.9% sodium chloride 10 mL injection  40 mg IntraVENous DAILY            Objective:     Patient Vitals for the past 8 hrs:   BP Temp Pulse Resp SpO2   03/26/19 0856 138/80 98.2 ??F (36.8 ??C) 88 16 96 %     No intake/output data recorded.  08/02 1901 - 08/04 0700  In: 2500 [P.O.:500; I.V.:2000]  Out: -     Physical Exam:   Visit Vitals  BP 138/80 (BP 1 Location: Right arm, BP Patient Position: At rest;Supine)   Pulse 88   Temp 98.2 ??F (36.8 ??C)   Resp 16   Ht '5\' 8"'  (1.727 m)   Wt 72.6 kg (160 lb)   SpO2 96%   BMI 24.33 kg/m??     General:  Alert, cooperative, no distress, appears  stated age.  Oriented x3.   Head:  Normocephalic, without obvious abnormality, atraumatic.   Eyes:  Conjunctivae/corneas clear. PERRL, EOMs intact. Fundi benign.   Ears:  Normal TMs and external ear canals both ears.   Nose: Nares normal. Septum midline. Mucosa normal. No drainage or sinus tenderness.   Throat: Lips, mucosa, and tongue normal. Teeth and gums normal.   Neck: Supple, symmetrical, trachea midline, no adenopathy, thyroid: no enlargement/tenderness/nodules, no carotid bruit and no JVD.   Back:   Symmetric, no curvature. ROM normal. No CVA tenderness.   Lungs:   Clear to auscultation bilaterally.  No wheezing, no rales   Chest wall:  No tenderness or deformity.   Heart:  Regular rate and rhythm, S1, S2 normal, no murmur, click, rub or gallop.   Breast Exam:  No tenderness, masses, or nipple abnormality as per patient.  Not examined.   Abdomen:   Soft, non-tender. Bowel sounds normal. No masses,  No organomegaly.   Genitalia:  Normal female without lesion, discharge or  tenderness as per patient.  Not examined   Rectal:  Normal tone,  no masses or tenderness as per patient.  Not examined   Extremities: Extremities normal, atraumatic, no cyanosis or edema.   Pulses: 2+ and symmetric all extremities.   Skin: Skin color, texture, turgor normal. No rashes or lesions.  No pressure ulcer.  RN Fleeta Emmer checked and confirmed   Lymph nodes: Cervical, supraclavicular, and axillary nodes normal.   Neurologic: CNII-XII intact. Normal strength, sensation and reflexes throughout.  Peripheral nervous system:  Sensory and motor are intact.           Data Review   Xr Chest Sngl V    Result Date: 03/25/2019  : THERE ARE NO ACUTE CARDIOPULMONARY FINDINGS SEEN. J:  [id number] D:  03/25/2019 15:48:05 T:  03/25/2019 15:55:30       Echo Results  (Last 48 hours)    None          Recent Results (from the past 24 hour(s))   CBC WITH AUTOMATED DIFF    Collection Time: 03/25/19  3:47 PM   Result Value Ref Range    WBC 7.7 4.0 - 10.0 K/uL     RBC 4.26 3.93 - 5.22 M/uL    HGB 13.4 11.8 - 14.8 g/dL    HCT 38.6 36.0 - 45.0 %    MCV 90.6 80.0 - 95.0 FL    MCH 31.5 25.6 - 32.2 PG    MCHC 34.7 32.2 - 35.5 g/dL    RDW 11.8 11.6 - 14.4 %    PLATELET 264 150 - 400 K/uL    MPV 10.2 9.4 - 12.4 FL    NEUTROPHILS 63 %    LYMPHOCYTES 22 %    MONOCYTES 10 %    EOSINOPHILS 4 %    BASOPHILS 1 0 - 1.2 %    ABS. NEUTROPHILS 4.9 1.6 - 6.1 K/UL    ABS. LYMPHOCYTES 1.7 1.2 - 3.7 K/UL    ABS. MONOCYTES 0.8 0.2 - 0.8 K/UL    ABS. EOSINOPHILS 0.3 0.0 - 0.5 K/UL    ABS. BASOPHILS 0.1 0.0 - 0.1 K/UL    DF AUTOMATED     IMMATURE GRANULOCYTES 0 0.0 - 0.4 %    ABS. IMM. GRANS. 0.0 0.00 - 1.24 K/UL   METABOLIC PANEL, COMPREHENSIVE    Collection Time: 03/25/19  3:47 PM   Result Value Ref Range    Sodium 140 136 - 145 mmol/L    Potassium 3.7 3.5 - 5.1 mmol/L    Chloride 101 98 - 110 mmol/L    CO2 28 21 - 32 mmol/L    Anion gap 11 5 - 15 mmol/L    Glucose 152 (H) 70 - 100 mg/dL    BUN 37 (H) 7 - 18 MG/DL    Creatinine 1.54 (H) 0.55 - 1.02 MG/DL    BUN/Creatinine ratio 24 (H) 12 - 20    GFR est AA 49 (L) >60 ml/min/1.6m    GFR est non-AA 43 (L) >60 ml/min/1.756m   Calcium 8.4 (L) 8.5 - 10.1 MG/DL    Bilirubin, total 0.68 0.20 - 1.20 mg/dL    ALT (SGPT) 31 13 - 56 U/L    AST (SGOT) 22 15 - 37 U/L    Alk. phosphatase 57 45 - 117 U/L    Protein, total 7.8 6.4 - 8.2 g/dL    Albumin 3.8 3.4 - 5.0 g/dL    A-G Ratio 1.0 1.0 - 3.0  SARS-COV-2    Collection Time: 03/25/19  3:47 PM   Result Value Ref Range    Specimen source THROAT     COVID-19 rapid test Not detected Not detected   EKG, 12 LEAD, INITIAL    Collection Time: 03/25/19  4:48 PM   Result Value Ref Range    Heart Rate 84 bpm    RR Interval 716 ms    Atrial Rate 84 ms    P-R Interval 162 ms    P Duration 122 ms    P Horizontal Axis 19 deg    P Front Axis 77 deg    Q Onset 501 ms    QRSD Interval 82 ms    QT Interval 376 ms    QTcB 444 ms    QTcF 420 ms    QRS Horizontal Axis -50 deg    QRS Axis 111 deg    I-40 Horizontal Axis -27  deg    I-40 Front Axis 90 deg    T-40 Horizontal Axis -75 deg    T-40 Front Axis 149 deg    T Horizontal Axis 25 deg    T Wave Axis 59 deg    S-T Horizontal Axis 74 deg    S-T Front Axis 32 deg   UA WITH REFLEX MICRO AND CULTURE    Collection Time: 03/25/19  6:42 PM    Specimen: Urine   Result Value Ref Range    Color YELLOW YELLOW    Appearance CLEAR CLEAR    Specific gravity 1.027 1.001 - 1.035    pH (UA) 6.5 5 - 8    Protein 30 (A) Negative mg/dL    Glucose Negative Negative mg/dL    Ketone >=160 (A) Negative mg/dL    Bilirubin Negative Negative    Blood Negative Negative    Urobilinogen 0.2 0.2 - 1.0 EU/dL    Nitrites Negative Negative    Leukocyte Esterase Negative Negative   URINE MICROSCOPIC WITH REFLEX CULTURE    Collection Time: 03/25/19  6:42 PM    Specimen: Urine   Result Value Ref Range    WBC NONE SEEN <5 /hpf    RBC 0-4 <5 /hpf    Bacteria TRACE (A) NONE SEEN /hpf    UA:UC IF INDICATED CULTURE NOT INDICATED BY UA RESULT     Epithelial cells 0-4  SQUAMOUS   <5 /hpf   ACETONE/KETONE, QL    Collection Time: 03/25/19  7:30 PM   Result Value Ref Range    Acetone/Ketone serum, QL. 20 mg/dL (A) Negative        GLUCOSE, POC    Collection Time: 03/25/19 11:55 PM   Result Value Ref Range    Glucose (POC) 151 (H) 70 - 105 mg/dL    Performed by Bing Ree    SARS-COV-2    Collection Time: 03/26/19 12:26 AM   Result Value Ref Range    Specimen source THROAT SWAB     Category PENDING     CRSP SARS-CoV-2, PCR PENDING    CBC WITH AUTOMATED DIFF    Collection Time: 03/26/19  5:54 AM   Result Value Ref Range    WBC 8.1 4.0 - 10.0 K/uL    RBC 3.65 (L) 3.93 - 5.22 M/uL    HGB 11.3 (L) 11.8 - 14.8 g/dL    HCT 33.3 (L) 36.0 - 45.0 %    MCV 91.2 80.0 - 95.0 FL    MCH 31.0 25.6 - 32.2 PG  MCHC 33.9 32.2 - 35.5 g/dL    RDW 11.7 11.6 - 14.4 %    PLATELET 248 150 - 400 K/uL    MPV 9.8 9.4 - 12.4 FL    NEUTROPHILS 57 %    LYMPHOCYTES 31 %    MONOCYTES 10 %    EOSINOPHILS 2 %    BASOPHILS 0 0 - 1.2 %    ABS. NEUTROPHILS  4.6 1.6 - 6.1 K/UL    ABS. LYMPHOCYTES 2.5 1.2 - 3.7 K/UL    ABS. MONOCYTES 0.8 0.2 - 0.8 K/UL    ABS. EOSINOPHILS 0.1 0.0 - 0.5 K/UL    ABS. BASOPHILS 0.0 0.0 - 0.1 K/UL    DF AUTOMATED     IMMATURE GRANULOCYTES 0 0.0 - 0.4 %    ABS. IMM. GRANS. 0.0 0.00 - 0.27 K/UL   METABOLIC PANEL, COMPREHENSIVE    Collection Time: 03/26/19  5:54 AM   Result Value Ref Range    Sodium 141 136 - 145 mmol/L    Potassium 3.6 3.5 - 5.1 mmol/L    Chloride 107 98 - 110 mmol/L    CO2 25 21 - 32 mmol/L    Anion gap 9 5 - 15 mmol/L    Glucose 107 (H) 70 - 100 mg/dL    BUN 34 (H) 7 - 18 MG/DL    Creatinine 1.33 (H) 0.55 - 1.02 MG/DL    BUN/Creatinine ratio 26 (H) 12 - 20    GFR est AA 58 (L) >60 ml/min/1.47m    GFR est non-AA 51 (L) >60 ml/min/1.735m   Calcium 7.5 (L) 8.5 - 10.1 MG/DL    Bilirubin, total 0.50 0.20 - 1.20 mg/dL    ALT (SGPT) 23 13 - 56 U/L    AST (SGOT) 16 15 - 37 U/L    Alk. phosphatase 48 45 - 117 U/L    Protein, total 6.5 6.4 - 8.2 g/dL    Albumin 2.9 (L) 3.4 - 5.0 g/dL    A-G Ratio 0.8 (L) 1.0 - 3.0     GLUCOSE, POC    Collection Time: 03/26/19  7:39 AM   Result Value Ref Range    Glucose (POC) 109 (H) 70 - 105 mg/dL    Performed by TyCeline Mans  GLUCOSE, POC    Collection Time: 03/26/19 11:00 AM   Result Value Ref Range    Glucose (POC) 292 (H) 70 - 105 mg/dL    Performed by AlConstance Holster      All Micro Results     None          XR CHEST SNGL V   Final Result   :   THERE ARE NO ACUTE CARDIOPULMONARY FINDINGS SEEN.         J: :  [id number] D:  03/25/2019 15:48:05   T:  03/25/2019 15:55:30                                     CT Results  (Last 48 hours)    None          * No surgery found *          Assessment:     Principal Problem:    AKI (acute kidney injury) ([facility](03/[place])    Active Problems:    Acute renal failure (ARF) ([place]03/[place]  Plan:     1. Acute renal failure secondary to dehydration, prerenal, possible due to nausea vomiting, diarrhea.  Renal failure is improving.  Continue 0.9% sodium chloride at  100 mL/hour.  Patient is started with full liquid diet and will be advanced with diabetic diet as tolerated.  IV Protonix is changed to p.o. Protonix 40 mg daily.    2. Diabetes mellitus, type 1. No anion gap.  Continue sliding scale insulin protocol.  Will check hemoglobin A1c to understand the status of control of diabetes mellitus.     3. Fibromyalgia.  Patient is complaining significant whole body ache.  She informed that Vicodin works for her as needed.  She is started with Percocet 5/325 mg 1 tablet p.o. 2 times daily p.r.n. pain for 2 days.      4. Deep vein thrombosis prophylaxis with bilateral leg compression device.    Palliative care management plan discussion:  I discussed with the patient about palliative care management plan, advanced directive and code status for which I spent separately 16 minutes.  The patient confirmed full code status.    Care Plan discussed with:   '[x]' Patient   '[]' Family    '[x]' Care Manager    '[x]' Nursing   '[]' Consultant/Specialist :     Disposition:  Possible discharge tomorrow if improves of acute renal failure can tolerate p.o. food      This document was generated with the aid of voice recognition software.. Please be aware that there may be inadvertent transcription errors not identified and corrected by the Judeen Hammans, MD  03/26/2019, 2:25 PM

## 2019-03-26 NOTE — Progress Notes (Signed)
Pt reported to this RN that she has not voided since 1800 on 8/3. Pt still receiving 236ml/hr NS and switched to a full liquid diet, able to keep everything down including clears. Bladder scanned pt at 1115 and 2ml were shown after 3 attempts of scanning. Dr. Sherlean Foot made aware, instructed to continue IVF

## 2019-03-27 LAB — HEMOGLOBIN A1C
Estimated Average Glucose mg/dL (INT/EXT): 114 mg/dL
HEMOGLOBIN A1C % (INT/EXT): 5.6 % (ref 4.2–6.3)

## 2019-03-27 LAB — BASIC METABOLIC PANEL
Anion Gap: 6 mmol/L (ref 5–15)
BUN: 22 MG/DL — ABNORMAL HIGH (ref 7–18)
Bun/Cre Ratio: 21 NA — ABNORMAL HIGH (ref 12–20)
CO2: 24 mmol/L (ref 21–32)
Calcium: 7.4 MG/DL — ABNORMAL LOW (ref 8.5–10.1)
Chloride: 109 mmol/L (ref 98–110)
Creatinine: 1.05 MG/DL — ABNORMAL HIGH (ref 0.55–1.02)
EGFR IF NonAfrican American: >60 mL/min/{1.73_m2} (ref >60)
GFR African American: >60 mL/min/{1.73_m2} (ref >60)
Glucose: 139 mg/dL — ABNORMAL HIGH (ref 70–100)
Potassium: 3.8 mmol/L (ref 3.5–5.1)
Sodium: 139 mmol/L (ref 136–145)

## 2019-03-27 LAB — POCT GLUCOSE
POC Glucose: 162 mg/dL — ABNORMAL HIGH (ref 70–105)
POC Glucose: 153 mg/dL — ABNORMAL HIGH (ref 70–105)
POC Glucose: 218 mg/dL — ABNORMAL HIGH (ref 70–105)

## 2019-03-27 LAB — HEMOGLOBIN A1C W/EAG
Hemoglobin A1C: 5.6 % (ref 4.2–6.3)
eAG: 114 mg/dL

## 2019-03-27 LAB — METABOLIC PANEL, BASIC
Anion gap: 6 mmol/L (ref 5–15)
BUN/Creatinine ratio: 21 — ABNORMAL HIGH (ref 12–20)
BUN: 22 MG/DL — ABNORMAL HIGH (ref 7–18)
CO2: 24 mmol/L (ref 21–32)
Calcium: 7.4 MG/DL — ABNORMAL LOW (ref 8.5–10.1)
Chloride: 109 mmol/L (ref 98–110)
Creatinine: 1.05 MG/DL — ABNORMAL HIGH (ref 0.55–1.02)
GFR est AA: >60 mL/min/{1.73_m2} (ref >60)
GFR est non-AA: >60 mL/min/{1.73_m2} (ref >60)
Glucose: 139 mg/dL — ABNORMAL HIGH (ref 70–100)
Potassium: 3.8 mmol/L (ref 3.5–5.1)
Sodium: 139 mmol/L (ref 136–145)

## 2019-03-27 LAB — GLUCOSE, POC
Glucose (POC): 162 mg/dL — ABNORMAL HIGH (ref 70–105)
Glucose (POC): 153 mg/dL — ABNORMAL HIGH (ref 70–105)
Glucose (POC): 218 mg/dL — ABNORMAL HIGH (ref 70–105)

## 2019-03-27 LAB — HEMOGLOBIN A1C WITH EAG
Est. average glucose: 114 mg/dL
Hemoglobin A1c: 5.6 % (ref 4.2–6.3)

## 2019-03-27 MED FILL — LORAZEPAM 2 MG/ML IJ SOLN: 2 mg/mL | INTRAMUSCULAR | Qty: 1

## 2019-03-27 MED FILL — OXYCODONE-ACETAMINOPHEN 5 MG-325 MG TAB: 5-325 mg | ORAL | Qty: 1

## 2019-03-27 MED FILL — DULOXETINE 30 MG CAP, DELAYED RELEASE: 30 mg | ORAL | Qty: 2

## 2019-03-27 MED FILL — METOCLOPRAMIDE 5 MG/ML IJ SOLN: 5 mg/mL | INTRAMUSCULAR | Qty: 2

## 2019-03-27 MED FILL — PANTOPRAZOLE 40 MG TAB, DELAYED RELEASE: 40 mg | ORAL | Qty: 1

## 2019-03-27 NOTE — Progress Notes (Signed)
Call placed to patient to inform her of her second negative COVID 19 test.

## 2019-03-27 NOTE — Progress Notes (Signed)
Problem: Falls - Risk of  Goal: *Absence of Falls  Description: Document Kathryn [name] Fall Risk and appropriate interventions in the flowsheet.  03/27/2019 1228 by Eudelia Bunch, RN  Outcome: Resolved/Met  03/27/2019 1109 by Eudelia Bunch, RN  Outcome: Progressing Towards Goal  Note: Fall Risk Interventions:            Medication Interventions: Teach patient to arise slowly                03/27/2019 0733 by Eudelia Bunch, RN  Outcome: Progressing Towards Goal  Note: Fall Risk Interventions:            Medication Interventions: Teach patient to arise slowly                   Problem: Patient Education: Go to Patient Education Activity  Goal: Patient/Family Education  03/27/2019 1228 by Eudelia Bunch, RN  Outcome: Resolved/Met  03/27/2019 1109 by Eudelia Bunch, RN  Outcome: Progressing Towards Goal  03/27/2019 0733 by Eudelia Bunch, RN  Outcome: Progressing Towards Goal     Problem: Nausea/Vomiting (Adult)  Goal: *Absence of nausea/vomiting  03/27/2019 1228 by Eudelia Bunch, RN  Outcome: Resolved/Met  03/27/2019 1109 by Eudelia Bunch, RN  Outcome: Progressing Towards Goal  03/27/2019 0733 by Eudelia Bunch, RN  Outcome: Progressing Towards Goal  Goal: *Palliation of nausea/vomiting (Palliative Care)  03/27/2019 1228 by Eudelia Bunch, RN  Outcome: Resolved/Met  03/27/2019 1109 by Eudelia Bunch, RN  Outcome: Progressing Towards Goal  03/27/2019 0733 by Eudelia Bunch, RN  Outcome: Progressing Towards Goal     Problem: Patient Education: Go to Patient Education Activity  Goal: Patient/Family Education  03/27/2019 1228 by Eudelia Bunch, RN  Outcome: Resolved/Met  03/27/2019 1109 by Eudelia Bunch, RN  Outcome: Progressing Towards Goal  03/27/2019 0733 by Eudelia Bunch, RN  Outcome: Progressing Towards Goal

## 2019-03-27 NOTE — Discharge Summary (Signed)
Discharge Summary    Patient: Alfonse FlavorsMargaret Amspacher MRN: [id number]  CSN: [id number]    Date of Birth: 02-25-1991  Age: 28 y.o.  Sex: female    DOA: 03/25/2019 LOS:  LOS: 1 day   Discharge Date:      Admission Diagnoses: AKI (acute kidney injury) (HCC) [N17.9]  Acute renal failure (ARF) (HCC) [N17.9]    Discharge Diagnoses:  Renal failure due to dehydration resolved                                           Acute gastroenteritis resolved  Problem List as of 03/27/2019 Never Reviewed          Codes Class Noted - Resolved    Acute renal failure (ARF) (HCC) ICD-10-CM: N17.9  ICD-9-CM: 584.9  03/26/2019 - Present        * (Principal) AKI (acute kidney injury) (HCC) ICD-10-CM: N17.9  ICD-9-CM: 584.9  03/25/2019 - Present              Discharge Condition: Stable    Visit Vitals  BP (!) 156/92 (BP 1 Location: Right arm, BP Patient Position: At rest;Supine)   Pulse 78   Temp 98.5 ??F (36.9 ??C)   Resp 18   Ht 5\' 8"  (1.727 m)   Wt 72.6 kg (160 lb)   SpO2 96%   BMI 24.33 kg/m??       Physical Exam  Constitutional:       Comments: Moderately built Caucasian lady lying on the bed without significant distress.   HENT:      Head: Normocephalic and atraumatic.      Right Ear: External ear normal.      Left Ear: External ear normal.      Nose: Nose normal.   Eyes:      Conjunctiva/sclera: Conjunctivae normal.      Pupils: Pupils are equal, round, and reactive to light.   Neck:      Musculoskeletal: Normal range of motion and neck supple.   Cardiovascular:      Rate and Rhythm: Normal rate and regular rhythm.      Heart sounds: Normal heart sounds.   Pulmonary:      Effort: Pulmonary effort is normal.      Breath sounds: Normal breath sounds.   Abdominal:      General: Bowel sounds are normal.      Palpations: Abdomen is soft.   Musculoskeletal: Normal range of motion.   Skin:     General: Skin is warm and dry.   Neurological:      Mental Status: She is alert and oriented to person, place, and time.      Gait: Gait is intact.       Deep Tendon Reflexes: Reflexes are normal and symmetric.   Psychiatric:         Mood and Affect: Mood and affect normal.         Cognition and Memory: Memory normal.         Judgment: Judgment normal.         Hospital Course:  This is a 28 year old lady with past medical history of type 1 diabetes mellitus, fibromyalgia, history of marijuana use presented emergency room with abdominal pain nausea vomiting patient is found to have dehydration with elevation of BUN up to 42 and creatinine of 1.6, and no sign of DKA lipase level  within normal limit and patient was admitted for supportive care including IV fluid CT scan of the abdomen was done which showed no acute abnormality.  Patient blood sugar was controlled by Lantus insulin sliding scale, now patient's symptom of nausea vomiting has improved and patient is tolerating solid food, renal function almost normal with BUN 22 creatinine 1.0, patient tolerating p.o. liquid as well.  Hemoglobin A1c was 5.6.  Patient is afebrile I patient will be discharged home today and follow-up primary care physician in 1 week.  I suggest patient to avoid marijuana because it can also cause nausea vomiting as well.  Patient has COVID-19 rapid  Test, was negative regular PCR test pending.      follow-up lab/imaging:     Significant Diagnostic Studies:Xr Chest Sngl V    Result Date: 03/25/2019  : THERE ARE NO ACUTE CARDIOPULMONARY FINDINGS SEEN. J:  [id number] D:  03/25/2019 15:48:05 T:  03/25/2019 15:55:30           Discharge Medications:     Current Discharge Medication List      CONTINUE these medications which have NOT CHANGED    Details   DULoxetine (CYMBALTA) 60 mg capsule Take 60 mg by mouth daily.      insulin glargine (Lantus Solostar U-100 Insulin) 100 unit/mL (3 mL) inpn 22 Units by SubCUTAneous route At bedtime.      insulin lispro (HUMALOG) 100 unit/mL kwikpen 10 Units by SubCUTAneous route three (3) times daily (with meals).       promethazine (PHENERGAN) 25 mg tablet Take 1 Tab by mouth every six (6) hours as needed for Nausea.  Qty: 12 Tab, Refills: 0             Activity: As tolerated    Special instruction:    Diet:  Active Orders   Diet    DIET DIABETIC CONSISTENT CARB Regular       Wound Care:       Follow-up: with PCP, [facility], Not On File in 7-10days    Minutes spent on discharge: >30 minutes spent coordinating this discharge (review instructions/follow-up, prescriptions, preparing report for sign off)    Key Anti-Platelet Anticoagulant Meds     The patient is on no antiplatelet meds or anticoagulants.           Anticoagulants, Warfarin, Coumadin Medication Administrations (last 5 days) (last 120 hours)     None           No results found for: INR, PTMR, PTP, PT1, PT2, INREXT

## 2019-03-27 NOTE — Progress Notes (Signed)
Problem: Falls - Risk of  Goal: *Absence of Falls  Description: Document Schmid Fall Risk and appropriate interventions in the flowsheet.  Outcome: Progressing Towards Goal  Note: Fall Risk Interventions:            Medication Interventions: Teach patient to arise slowly                   Problem: Patient Education: Go to Patient Education Activity  Goal: Patient/Family Education  Outcome: Progressing Towards Goal     Problem: Nausea/Vomiting (Adult)  Goal: *Absence of nausea/vomiting  Outcome: Progressing Towards Goal  Goal: *Palliation of nausea/vomiting (Palliative Care)  Outcome: Progressing Towards Goal     Problem: Patient Education: Go to Patient Education Activity  Goal: Patient/Family Education  Outcome: Progressing Towards Goal

## 2019-03-27 NOTE — Progress Notes (Signed)
Problem: Falls - Risk of  Goal: *Absence of Falls  Description: Document Schmid Fall Risk and appropriate interventions in the flowsheet.  03/27/2019 1109 by Allen, Brynn M, RN  Outcome: Progressing Towards Goal  Note: Fall Risk Interventions:            Medication Interventions: Teach patient to arise slowly                03/27/2019 0733 by Allen, Brynn M, RN  Outcome: Progressing Towards Goal  Note: Fall Risk Interventions:            Medication Interventions: Teach patient to arise slowly                   Problem: Patient Education: Go to Patient Education Activity  Goal: Patient/Family Education  03/27/2019 1109 by Allen, Brynn M, RN  Outcome: Progressing Towards Goal  03/27/2019 0733 by Allen, Brynn M, RN  Outcome: Progressing Towards Goal     Problem: Nausea/Vomiting (Adult)  Goal: *Absence of nausea/vomiting  03/27/2019 1109 by Allen, Brynn M, RN  Outcome: Progressing Towards Goal  03/27/2019 0733 by Allen, Brynn M, RN  Outcome: Progressing Towards Goal  Goal: *Palliation of nausea/vomiting (Palliative Care)  03/27/2019 1109 by Allen, Brynn M, RN  Outcome: Progressing Towards Goal  03/27/2019 0733 by Allen, Brynn M, RN  Outcome: Progressing Towards Goal     Problem: Patient Education: Go to Patient Education Activity  Goal: Patient/Family Education  03/27/2019 1109 by Allen, Brynn M, RN  Outcome: Progressing Towards Goal  03/27/2019 0733 by Allen, Brynn M, RN  Outcome: Progressing Towards Goal

## 2019-03-27 NOTE — Progress Notes (Signed)
Problem: Falls - Risk of  Goal: *Absence of Falls  Description: Document Bridgette Habermann Fall Risk and appropriate interventions in the flowsheet.  03/27/2019 1228 by Samule Ohm, RN  Outcome: Resolved/Met  03/27/2019 1109 by Samule Ohm, RN  Outcome: Progressing Towards Goal  Note: Fall Risk Interventions:            Medication Interventions: Teach patient to arise slowly                03/27/2019 0733 by Samule Ohm, RN  Outcome: Progressing Towards Goal  Note: Fall Risk Interventions:            Medication Interventions: Teach patient to arise slowly                   Problem: Patient Education: Go to Patient Education Activity  Goal: Patient/Family Education  03/27/2019 1228 by Samule Ohm, RN  Outcome: Resolved/Met  03/27/2019 1109 by Samule Ohm, RN  Outcome: Progressing Towards Goal  03/27/2019 0733 by Samule Ohm, RN  Outcome: Progressing Towards Goal     Problem: Nausea/Vomiting (Adult)  Goal: *Absence of nausea/vomiting  03/27/2019 1228 by Samule Ohm, RN  Outcome: Resolved/Met  03/27/2019 1109 by Samule Ohm, RN  Outcome: Progressing Towards Goal  03/27/2019 0733 by Samule Ohm, RN  Outcome: Progressing Towards Goal  Goal: *Palliation of nausea/vomiting (Palliative Care)  03/27/2019 1228 by Samule Ohm, RN  Outcome: Resolved/Met  03/27/2019 1109 by Samule Ohm, RN  Outcome: Progressing Towards Goal  03/27/2019 0733 by Samule Ohm, RN  Outcome: Progressing Towards Goal     Problem: Patient Education: Go to Patient Education Activity  Goal: Patient/Family Education  03/27/2019 1228 by Samule Ohm, RN  Outcome: Resolved/Met  03/27/2019 1109 by Samule Ohm, RN  Outcome: Progressing Towards Goal  03/27/2019 0733 by Samule Ohm, RN  Outcome: Progressing Towards Goal

## 2019-03-27 NOTE — Progress Notes (Signed)
Problem: Falls - Risk of  Goal: *Absence of Falls  Description: Document Kathryn [name] Fall Risk and appropriate interventions in the flowsheet.  03/27/2019 1109 by Samule Ohm, RN  Outcome: Progressing Towards Goal  Note: Fall Risk Interventions:            Medication Interventions: Teach patient to arise slowly                03/27/2019 0733 by Samule Ohm, RN  Outcome: Progressing Towards Goal  Note: Fall Risk Interventions:            Medication Interventions: Teach patient to arise slowly                   Problem: Patient Education: Go to Patient Education Activity  Goal: Patient/Family Education  03/27/2019 1109 by Samule Ohm, RN  Outcome: Progressing Towards Goal  03/27/2019 0733 by Samule Ohm, RN  Outcome: Progressing Towards Goal     Problem: Nausea/Vomiting (Adult)  Goal: *Absence of nausea/vomiting  03/27/2019 1109 by Samule Ohm, RN  Outcome: Progressing Towards Goal  03/27/2019 0733 by Samule Ohm, RN  Outcome: Progressing Towards Goal  Goal: *Palliation of nausea/vomiting (Palliative Care)  03/27/2019 1109 by Samule Ohm, RN  Outcome: Progressing Towards Goal  03/27/2019 0733 by Samule Ohm, RN  Outcome: Progressing Towards Goal     Problem: Patient Education: Go to Patient Education Activity  Goal: Patient/Family Education  03/27/2019 1109 by Samule Ohm, RN  Outcome: Progressing Towards Goal  03/27/2019 0733 by Samule Ohm, RN  Outcome: Progressing Towards Goal

## 2019-03-27 NOTE — Discharge Summary (Signed)
Discharge Summary    Patient: Kathryn [name] MRN: [id number]  CSN: [id number]    Date of Birth: September 21, 1990  Age: 28 y.o.  Sex: female    DOA: 03/25/2019 LOS:  LOS: 1 day   Discharge Date:      Admission Diagnoses: AKI (acute kidney injury) (HCC) [N17.9]  Acute renal failure (ARF) (HCC) [N17.9]    Discharge Diagnoses:  Renal failure due to dehydration resolved                                           Acute gastroenteritis resolved  Problem List as of 03/27/2019 Never Reviewed          Codes Class Noted - Resolved    Acute renal failure (ARF) (HCC) ICD-10-CM: N17.9  ICD-9-CM: 584.9  03/26/2019 - Present        * (Principal) AKI (acute kidney injury) (HCC) ICD-10-CM: N17.9  ICD-9-CM: 584.9  03/25/2019 - Present              Discharge Condition: Stable    Visit Vitals  BP (!) 156/92 (BP 1 Location: Right arm, BP Patient Position: At rest;Supine)   Pulse 78   Temp 98.5 ??F (36.9 ??C)   Resp 18   Ht 5\' 8"  (1.727 m)   Wt 72.6 kg (160 lb)   SpO2 96%   BMI 24.33 kg/m??       Physical Exam  Constitutional:       Comments: Moderately built Caucasian lady lying on the bed without significant distress.   HENT:      Head: Normocephalic and atraumatic.      Right Ear: External ear normal.      Left Ear: External ear normal.      Nose: Nose normal.   Eyes:      Conjunctiva/sclera: Conjunctivae normal.      Pupils: Pupils are equal, round, and reactive to light.   Neck:      Musculoskeletal: Normal range of motion and neck supple.   Cardiovascular:      Rate and Rhythm: Normal rate and regular rhythm.      Heart sounds: Normal heart sounds.   Pulmonary:      Effort: Pulmonary effort is normal.      Breath sounds: Normal breath sounds.   Abdominal:      General: Bowel sounds are normal.      Palpations: Abdomen is soft.   Musculoskeletal: Normal range of motion.   Skin:     General: Skin is warm and dry.   Neurological:      Mental Status: She is alert and oriented to person, place, and time.      Gait: Gait is intact.      Deep Tendon Reflexes:  Reflexes are normal and symmetric.   Psychiatric:         Mood and Affect: Mood and affect normal.         Cognition and Memory: Memory normal.         Judgment: Judgment normal.         Hospital Course:  This is a 28 year old lady with past medical history of type 1 diabetes mellitus, fibromyalgia, history of marijuana use presented emergency room with abdominal pain nausea vomiting patient is found to have dehydration with elevation of BUN up to 42 and creatinine of 1.6, and no sign of DKA lipase level  within normal limit and patient was admitted for supportive care including IV fluid CT scan of the abdomen was done which showed no acute abnormality.  Patient blood sugar was controlled by Lantus insulin sliding scale, now patient's symptom of nausea vomiting has improved and patient is tolerating solid food, renal function almost normal with BUN 22 creatinine 1.0, patient tolerating p.o. liquid as well.  Hemoglobin A1c was 5.6.  Patient is afebrile I patient will be discharged home today and follow-up primary care physician in 1 week.  I suggest patient to avoid marijuana because it can also cause nausea vomiting as well.  Patient has COVID-19 rapid  Test, was negative regular PCR test pending.      follow-up lab/imaging:     Significant Diagnostic Studies:Xr Chest Sngl V    Result Date: 03/25/2019  : THERE ARE NO ACUTE CARDIOPULMONARY FINDINGS SEEN. J:  [id number] D:  03/25/2019 15:48:05 T:  03/25/2019 15:55:30           Discharge Medications:     Current Discharge Medication List      CONTINUE these medications which have NOT CHANGED    Details   DULoxetine (CYMBALTA) 60 mg capsule Take 60 mg by mouth daily.      insulin glargine (Lantus Solostar U-100 Insulin) 100 unit/mL (3 mL) inpn 22 Units by SubCUTAneous route At bedtime.      insulin lispro (HUMALOG) 100 unit/mL kwikpen 10 Units by SubCUTAneous route three (3) times daily (with meals).      promethazine (PHENERGAN) 25 mg tablet Take 1 Tab by mouth every six (6)  hours as needed for Nausea.  Qty: 12 Tab, Refills: 0             Activity: As tolerated    Special instruction:    Diet:  Active Orders   Diet    DIET DIABETIC CONSISTENT CARB Regular       Wound Care:       Follow-up: with PCP, [facility], Not On File in 7-10days    Minutes spent on discharge: >30 minutes spent coordinating this discharge (review instructions/follow-up, prescriptions, preparing report for sign off)    Key Anti-Platelet Anticoagulant Meds     The patient is on no antiplatelet meds or anticoagulants.           Anticoagulants, Warfarin, Coumadin Medication Administrations (last 5 days) (last 120 hours)     None           No results found for: INR, PTMR, PTP, PT1, PT2, INREXT

## 2019-03-27 NOTE — Progress Notes (Signed)
Call placed to patient to inform her of her second negative COVID 19 test.

## 2019-03-28 LAB — COVID-19: Crsp Sars-Cov-2, PCR: NEGATIVE

## 2019-03-28 LAB — SARS-COV-2: CRSP SARS-CoV-2, PCR: NEGATIVE

## 2019-08-30 LAB — BMP (EXT)
Anion Gap (EXT): 12 mmol/L (ref 3–17)
BUN (EXT): 53 mg/dL — ABNORMAL HIGH (ref 8–25)
CO2 (EXT): 24 mmol/L (ref 23–32)
CalciumCalcium (EXT): 9.3 mg/dL (ref 8.5–10.5)
Chloride (EXT): 107 mmol/L (ref 98–108)
Creatinine (EXT): 1.47 mg/dL (ref 0.60–1.50)
GFR Estimated (Calc) (EXT): 48 mL/min/{1.73_m2} — ABNORMAL LOW (ref >59)
Glucose (EXT): 63 mg/dL — ABNORMAL LOW (ref 70–110)
Potassium (EXT): 4.3 mmol/L (ref 3.4–5.0)
Sodium (EXT): 143 mmol/L (ref 135–145)

## 2019-08-30 LAB — LIPID PROFILE (EXT)
Cholesterol (EXT): 226 mg/dL
HDL Cholesterol (EXT): 56 mg/dL (ref 35–100)
LDL Cholesterol (EXT): 155 mg/dL — ABNORMAL HIGH (ref 50–129)
NON HDL Cholesterol (EXT): 170 mg/dL
Risk Factor (EXT): 4 (ref 0.0–5.0)
Triglycerides (EXT): 76 mg/dL (ref 40–150)

## 2019-08-30 LAB — UNMAPPED LAB RESULTS: Creatinine, urine, random (INT/EXT): 140 mg/dL

## 2019-08-30 LAB — HEMOGLOBIN A1C
Estimated Average Glucose mg/dL (INT/EXT): 105 mg/dL
HEMOGLOBIN A1C % (INT/EXT): 5.3 % (ref 4.3–6.4)

## 2019-08-30 LAB — MICROALBUMIN URINE (EXT)
Albumin, urine (INT/EXT): 3 mg/dL — ABNORMAL HIGH (ref 0.0–2.0)
Creatinine, urine, random (INT/EXT): 179 mg/dL
Microalbumin/Creatinine Ratio Urine (EXT): 16.8 mg/g{creat} (ref <30.0)

## 2019-09-02 LAB — BMP (EXT)
Anion Gap (EXT): 7 mmol/L (ref 3–17)
BUN (EXT): 36 mg/dL — ABNORMAL HIGH (ref 8–25)
CO2 (EXT): 24 mmol/L (ref 23–32)
CalciumCalcium (EXT): 8.9 mg/dL (ref 8.5–10.5)
Chloride (EXT): 109 mmol/L — ABNORMAL HIGH (ref 98–108)
Creatinine (EXT): 1.39 mg/dL (ref 0.60–1.50)
GFR Estimated (Calc) (EXT): 52 mL/min/{1.73_m2} — ABNORMAL LOW (ref >59)
Glucose (EXT): 128 mg/dL — ABNORMAL HIGH (ref 70–110)
Potassium (EXT): 4.7 mmol/L (ref 3.4–5.0)
Sodium (EXT): 140 mmol/L (ref 135–145)

## 2019-09-06 LAB — BMP (EXT)
Anion Gap (EXT): 11 mmol/L (ref 3–17)
BUN (EXT): 41 mg/dL — ABNORMAL HIGH (ref 8–25)
CO2 (EXT): 25 mmol/L (ref 23–32)
CalciumCalcium (EXT): 9.6 mg/dL (ref 8.5–10.5)
Chloride (EXT): 104 mmol/L (ref 98–108)
Creatinine (EXT): 1.33 mg/dL (ref 0.60–1.50)
GFR Estimated (Calc) (EXT): 55 mL/min/{1.73_m2} — ABNORMAL LOW (ref >59)
Glucose (EXT): 117 mg/dL — ABNORMAL HIGH (ref 70–110)
Potassium (EXT): 4.4 mmol/L (ref 3.4–5.0)
Sodium (EXT): 140 mmol/L (ref 135–145)

## 2019-09-17 LAB — BMP (EXT)
Anion Gap (EXT): 8 mmol/L (ref 3–17)
BUN (EXT): 41 mg/dL — ABNORMAL HIGH (ref 8–25)
CO2 (EXT): 25 mmol/L (ref 23–32)
CalciumCalcium (EXT): 9 mg/dL (ref 8.5–10.5)
Chloride (EXT): 107 mmol/L (ref 98–108)
Creatinine (EXT): 1.51 mg/dL — ABNORMAL HIGH (ref 0.60–1.50)
GFR Estimated (Calc) (EXT): 47 mL/min/{1.73_m2} — ABNORMAL LOW (ref >59)
Glucose (EXT): 126 mg/dL — ABNORMAL HIGH (ref 70–110)
Potassium (EXT): 4.7 mmol/L (ref 3.4–5.0)
Sodium (EXT): 140 mmol/L (ref 135–145)

## 2019-09-22 ENCOUNTER — Ambulatory Visit: Admitting: Specialist

## 2019-10-21 ENCOUNTER — Ambulatory Visit: Admitting: Specialist

## 2020-04-03 ENCOUNTER — Ambulatory Visit: Admitting: Internal Medicine

## 2020-04-09 ENCOUNTER — Ambulatory Visit: Admitting: Emergency Medicine

## 2020-04-11 ENCOUNTER — Ambulatory Visit: Admitting: Emergency Medicine

## 2020-09-03 LAB — AMB EXT URINE ALBUMIN: MICROALBUMIN, URINE, EXTERNAL: 13.8 NA

## 2020-09-03 LAB — AMB EXT URINE MICROALBUMIN: Urine Microalbumin, External: 13.8

## 2020-11-16 NOTE — Progress Notes (Signed)
* * *        **  Angela [name]**    --- ---    68 Y old Female, DOB: 07-13-1991    [address], [place], [place] [address]    Home: [phone]    Provider: Jordan Likes        * * *    Telephone Encounter    ---    Answered by   Dwyane Dee  Date: 04/26/2017         Time: 03:10 PM    Caller   mother    --- ---            Reason   cxd appt            Message                      mom called states pt in inpatient at [facility] and did not want to reshedule.                    * * *                ---          * * *          Patient: [name], Angela [name] DOB: 1991/03/23 Provider: Junious Dresser P  04/26/2017    ---    Note generated by eClinicalWorks EMR/PM Software ([email or web address])

## 2020-11-17 NOTE — Progress Notes (Signed)
* * *        **  Angela [name]**    --- ---    81 Y old Female, DOB: 1991/02/26    [address] [place], [place], [name] [address]    Home: [phone]    Provider: Azucena Fallen        * * *    Telephone Encounter    ---    Answered by   [name]  Date: 04/13/2017         Time: 10:34 AM    [name]    --- ---            Reason   updates            Action Taken   [name],[name] 04/13/2017 10:38:51 AM > [name] jst some updates  from [place] today on [place]. She wanted you to know she is seeing the  psychiatrist at [facility] on 8/27, The rhuematologist at [facility] on 8/28,  Infectious disease at [facility] on 04/26/17 and will also be booking an appointment  with Dr Rush Barer is near future for her knee pain which she has not spoken to  you about but she stated [name] did have a displaced knee cap in past and it  is bothering her. Her insurance does not require referrals- she plans to see  Dr Rush Barer in future for that. Kailer Heindel,Angela [name] 04/16/2017 8:00:23 PM >  Replied to patient                * * *         **eMessages**   From:   [name],Angela [name]    --- ---    Created:   2017-04-16 20:00:15    Sent:   2017-04-16 20:00:25    Subject:   [id number]    Message:                      [name],[name]  04/13/2017 10:38:51 AM >  [name] jst some updates from [place] today on [place].  She wanted you to know  she is seeing the psychiatrist at [facility] on 8/27, The rhuematologist at [facility] on 8/28, Infectious disease at [facility] on 04/26/17 and will also be booking an appointment with Dr Rush Barer is near future for her knee pain which she has not spoken to you about but she stated [name] did have a displaced knee cap in past and it is bothering her.  Her insurance does not require referrals- she plans to see Dr Rush Barer in future for that.          [name], [name] And your parents if they're helping you with messages. :) Thank  you for all the updates. I'm glad you are getting seen so quickly. How are  you  feeling??? I hope you're better! Why are you seeing Infectious Disease-do you  have a known infection or are you concerned about one? Thanks again, [name]                    ---          * * *          Patient: Angela [name], Angela [name] DOB: 07/17/1991 Provider: Azucena Fallen  04/13/2017    ---    Note generated by eClinicalWorks EMR/PM Software ([email or web address])

## 2020-11-17 NOTE — Progress Notes (Signed)
* * *        **[name]    --- ---    61 Y old Female, DOB: [birth year]    Account Number: [id number]    [address] [place], [place]    Home: [phone]    Guarantor: [name] Insurance: [employer] Payer ID: [id number]    PCP: Howell Pringle    Appointment Facility: [facility]        * * *    04/28/2017  Progress Notes: [name] Dandrae Kustra, APGNP.    --- ---    ---         **Current Medications**    ---    Taking     * MetFORMIN HCl ER 500 mg Tablet Extended Release 24 Hour 1 tablet Orally twice a day (bid)    ---    * Duloxetine HCl 60 MG Capsule Delayed Release Particles 1 capsule Orally Once a day    ---    * NovoLog 100 UNIT/ML Solution Subcutaneous     ---    * Lantus 100 UNIT/ML Solution 25 units Subcutaneous at bedtime    ---    * OneTouch Verio Flex System w/Device Kit USE METER 3 TO 4 TIMES A DAY     ---    * Nitrofurantoin Monohyd Macro 100 MG Capsule 1 capsule with food Orally every 12 hrs    ---    * Advil 200 MG Tablet 1 tablet with food or milk as needed Orally Three times a day    ---    * Lyrica 25 MG Capsule 1 capsule Orally Twice a day    ---    * Midodrine HCl 5 MG Tablet 1 tablet Orally Three times a day    ---    * PredniSONE 1 MG Tablet 3 tablets with food or milk Orally Once a day    ---    * Medication List reviewed and reconciled with the patient    ---      Past Medical History    ---       Medical History Verified..        ---       **Surgical History**    ---       No Surgical History documented.    ---       **Family History**    ---       Father: alive, diagnosed with Hypertension    ---    Mother: alive, Hypertension    ---       **Allergies**    ---       Tetracycline HCl    ---    Neurontin: nausea - Side Effects    ---       **Hospitalization/Major Diagnostic Procedure**    ---       Pain, UTI-[facility] ED 03/2017    ---    Orthostatic hypotension-[facility] 03/2017    ---       **Review of Systems**    ---    All other systems were reviewed  and were negative except per the history of  present illness.         **Reason for Appointment**    ---       1\. Hospital follow-up    ---      **History of Present Illness**    ---     _*._ :    The patient presents today for a hospital  followup. The patient was  hospitalized at [facility] recently. There is no documentation  available for this visit from [facility].    The patient is accompanied today to today's visit by her mother, who is very  involved and supportive.    The patient and mother report that the patient went to be seen by a physician,  who specializes in fibromyalgia at [facility]. The physician was  concerned about [name]'s clinical status and recommended that she be  evaluated at [facility]. The patient went to [facility]  and the main concern was the patient's blood pressures. They report that the  patient's blood pressure dropped as low as 47/32 at [facility]. They report  that the patient had a very extensive workup with diagnostic tests and  multiple specialty consults including Rheumatology and Endocrinology.    The patient states that the cause of the orthostasis was issues with her  adrenal glands. She was placed on midodrine three times a day. She is unsure  of the dose. She is also taking prednisone 3 mg per day.    They also [facility] a [facility], who recommended starting CBD oil, which the patient finds somewhat  effective, but causes loose stools.    The patient will be following up with Rheumatology next week and also  Endocrinology. Endocrinology will also be managing the patient's diabetes. For  now, she does request a refill on Lantus and NovoLog.    The patient's Norco was decreased to twice a day with one as needed. The  patient was tolerating this fairly well until two days ago when the pain  seemed to increase also skin sensitivity. The patient reports that she wakes  up sweating  every time she falls asleep. Her feet are also very painful per  her report.    The patient is also being followed by Psychiatry and is attending mental  health therapy weekly.    The patient's mother requests that family medical leave paperwork be  completed, so she is going to accompany the patient to her multiple  appointments, which is reasonable.       **Vital Signs**    ---    Wt 128, Ht 68, BMI 19.46, Temp 97.0, HR 112, BP 100/70, RR 16, SaO2 99% (RA),  BP sitting 90/70, BP supine 70/40.       **Physical Examination**    ---     _GENERAL_ :    Appears stated as: yes. Build: normal . Eye contact: normal. General  Appearance: appears less uncomfortable and stiff. Hygiene: good. Ill-  appearance: none. Mental Status: alert and oriented. Mood/Affect: sad,  tearful, and anxious and anxious at times. Race: caucasion. Speech: clear.    _HEENT_ :    Eyes: non-icteric sclera, conjunctiva clear. Head: atraumatic, normocephalic.    _NECK_ :    Jugular venous distension: none. Neck supple.    _LUNGS_ :    Auscultation: CTA bilaterally. Effort: no respiratory distress.    _HEART_ :    Edema: trace LLE edema. Heart sounds: normal. Murmurs: none. Rate:  tachycardic-128. Rhythm: regular.    _NEUROLOGICAL_ :    Cranial Nerves: CN's II-XII grossly intact. Gait: slow.    _SKIN_ :    Color: good. General: warm, moist.    _PSYCHOLOGY_ :    Memory: good .          **Assessments**    ---  1\. Fibromyalgia - M79.7 (Primary)    ---    2\. Type 1 diabetes - E10.9    ---    3\. Orthostatic hypotension - I95.1    ---       **Treatment**    ---       **1\. Fibromyalgia**    Refill Hydrocodone-Acetaminophen Tablet, 10-325 MG, 1 tablet as needed,  Orally, three times a day (tid), 15 days, 45, Refills 0    Notes:      1. The plan is to eventually taper the patient off the hydrocodone when her pain is eventually well controlled. The patient will try to take two tablets as needed per day and not take it three times a day as she can.    .     ---        **2\. Type 1 diabetes**    Refill NovoLog Solution, 100 UNIT/ML, as directed per sliding scale,  Subcutaneous, four times a day (qid) as needed (prn), 30 days, 1, Refills 1    Refill Lantus Solution, 100 UNIT/ML, 25 units, Subcutaneous, at bedtime, 30  days, 1, Refills 1    Notes:    We will refill the patient's insulin. She will be then following with  Endocrinology.    .        **3\. Orthostatic hypotension**    Notes:      1. The patient was orthostatic today, but not symptomatic. Information about vital signs written down for the patient and her mother and they will call the prescriber of the midodrine and prednisone to see if they want to adjust these medications.    .        **4\. Others**    Notes:      1. Hospitalization within the last 30 days. We will complete the FMLA paperwork for the patient's mother and we will call when that is ready for her to pick up. She is aware and in agreement with this plan.    .    Electronically signed by Azucena Fallen , APGNP on 05/24/2017 at 04:56 PM  EDT    Sign off status: Completed        * * *        [facility]    [address]    [address]    [place], [place] [address]    Tel: [phone]    Fax: [phone]              * * *          Patient: Lameeka, Schleifer DOB: 08/08/1991 Progress Note: [name] Kieanna Rollo,  APGNP. 04/28/2017    ---    Note generated by eClinicalWorks EMR/PM Software ([email or web address])

## 2020-11-17 NOTE — Progress Notes (Signed)
* * *        **Angela [name]    --- ---    68 Y old Female, DOB: 01-Nov-1990    Account Number: [id number]    [address] [place], [place]    Home: [phone]    Guarantor: Angela [name] Insurance: [employer]ity Payer ID: [id number]    PCP: Howell Pringle    Appointment Facility: [facility]        * * *    04/06/2017  Progress Notes: Angela [name], APGNP.    --- ---    ---        Current Medications    ---    Taking     * MetFORMIN HCl ER 500 mg Tablet Extended Release 24 Hour 1 tablet Orally twice a day (bid)    ---    * Xanax 0.25 MG Tablet 0.5 tablet as needed Orally once a day    ---    * Lyrica 25 MG Capsule 1 capsule Orally Twice a day    ---    * Duloxetine HCl 60 MG Capsule Delayed Release Particles 1 capsule Orally Once a day    ---    * Hydrocodone-Acetaminophen 5-325 MG Tablet 1 tablet as needed Orally three times a day (tid), stop date 04/07/2017    ---    * NovoLog 100 UNIT/ML Solution Subcutaneous     ---    * Lantus 100 UNIT/ML Solution 25 units Subcutaneous at bedtime    ---    * OneTouch Verio Flex System w/Device Kit USE METER 3 TO 4 TIMES A DAY     ---    * Nitrofurantoin Monohyd Macro 100 MG Capsule 1 capsule with food Orally every 12 hrs    ---    * Medication List reviewed and reconciled with the patient    ---      Past Medical History    ---       No Medical History.Marland Kitchen        ---      Surgical History    ---      No Surgical History documented.    ---      Family History    ---      Father: alive, diagnosed with Hypertension    ---    Mother: alive, diagnosed with Hypertension    ---      Social History    ---     Sharion Settler 2017:_    Post-Discharge Med Rec Medication Reconciliation: Reconciled current and  discharge medications. no Smoking Are you a: never smoker. Smoking  Cessation/Exceptions Smoking Status Reviewed: 03/28/2017. Fall Risk Assessment  Fall Risk Assessment: No falls in the past year. BMI Follow-up Plan BMI LAST  COMPLETED:  03/28/2017 Normal BMI.      Allergies    ---      Tetracycline HCl    ---    Neurontin: nausea: Side Effects    ---      Hospitalization/Major Diagnostic Procedure    ---      Pain, UTI-[facility] ED 03/2017    ---      Review of Systems    ---    All other systems were reviewed and were negative except per the history of  present illness.        Reason for Appointment    ---      1\. Follow-up    ---  History of Present Illness    ---     _*._ :    The patient presents today for a followup visit. She is accompanied by her  father, [name].    The patient has been seen by the physical therapist, occupational therapist,  and the psychotherapist at [facility] this week. She is waiting to see  the physician who specializes in fibromyalgia; that appointment is not until  05/01/2017.    The patient went to the [facility] Emergency Department yesterday  due to ongoing widespread body pain and due to increasing lower back pain. She  was noted to have a urinary tract infection and was started on Macrobid. The  patient received six hydrocodone 10-300 tablets at [facility].    This is confirmed through [facility] and by calling the patient's pharmacy and  reviewing the [facility] ED note.    The patient reports that she is still having widespread pain in her neck,  back, arms, and legs. She feels like she is having some blurred vision, which  is new.    The patient is not sure how much Lyrica she is taking but she thinks that she  is taking two capsules twice a day, and the direction is for one capsule twice  a day. The patient's father states that he will confirm that dose and let me  know, as it could be contributing to the blurred vision. The patient also  reports dizziness and lightheadedness. The patient's blood pressure is low  today, at 90/80, and repeat was 90/70.    The patient reports that she is adherent with checking her capillary blood  sugars and they have all been "good".    The patient's father  spent much of the visit advocating for the patient's  hydrocodone to be increased back to 10 mg three times a day. She states that  she wants to break the pain cycle and does not want to see his daughter  suffering. He is stating that she took the hydrocodone for two months, three  years ago, and then was able to stop it. He states that he is less concerned  about addiction than the pain.    The patient is also being referred to a rheumatologist at [facility].      Vital Signs    ---    Ht 68, HR 123, BP 90/80, RR 14, SaO2 98% (RA), Repeat BP 90/70.      Physical Examination    ---     _GENERAL_ :    Appears stated as: yes. Build: normal . Eye contact: normal. General  Appearance: appears uncomfortable and stiff. Hygiene: good. Ill-appearance:  none. Mental Status: alert and oriented. Mood/Affect: sad, tearful, anxious.  Race: caucasion. Speech: clear.    _HEENT_ :    Eyes: non-icteric sclera, conjunctiva clear. Head: atraumatic, normocephalic.    _NECK_ :    Jugular venous distension: none. Neck supple.    _LUNGS_ :    Auscultation: CTA bilaterally. Effort: no respiratory distress.    _HEART_ :    Edema: trace LLE edema. Heart sounds: normal. Murmurs: none. Rate: mild  tachycardia-110. Rhythm: regular.    _NEUROLOGICAL_ :    Cranial Nerves: CN's II-XII grossly intact. Gait: slow.    _SKIN_ :    Color: pale. General: warm, moist.    _PSYCHOLOGY_ :    Memory: good .    _BACK_ :    Lower back pain: yes.  Discomfort with any palpation of back, arms, and legs.      Assessments    ---    1\. Fibromyalgia - M79.7 (Primary)    ---    2\. Pain - R52    ---    3\. Type 1 diabetes - E10.9    ---    4\. Severe anxiety with panic - F41.0    ---    5\. Urinary tract infection without hematuria, site unspecified - N39.0    ---    6\. Other specified hypotension - I95.89    ---      Treatment    ---       **1\. Fibromyalgia**    Refill Hydrocodone-Acetaminophen Tablet, 10-325 MG, 1 tablet as needed,  Orally, three  times a day (tid), 15 days, 45, Refills 0    Notes: Controlled medication agreement was signed. Advised the patient that  there will be no earlier refills of the Norco and she is not to obtain this  medication or any other controlled substances at any other facility unless the  pain specialist takes over. She and her father verbalized understanding. We  will discuss with Dr. Darrick Penna as well. Emotional support was provided.    Father is to contact the office with the Lyrica dose.    ---        **2\. Type 1 diabetes**    Notes: The patient has been following her capillary blood sugars and states  that they are in the normal range. Consider checking Hgb A1c. The patient will  need to re-establish care with an endocrinologist.        **3\. Severe anxiety with panic**    Stop Xanax Tablet, 0.25 MG, 0.5 tablet as needed, Orally, once a day    Notes: Stop the Xanax. The patient was only using it sparingly, but she will  not be taking it with a low blood pressure.        **4\. Urinary tract infection without hematuria, site unspecified**    Notes: Continue the Macrobid. We will await sensitivities.        **5\. Other specified hypotension**    Notes: Most likely is related to hypokalemia. Advised the patient to increase  fluids, even though she states she thinks she is drinking enough. The patient  is sweating a lot. The patient's father will purchase a blood pressure cuff to  monitor blood pressures at home. They are aware that, if the blood pressure  goes really any lower or if she becomes more systematic, she should be  evaluated in the emergency department. We will await the labs from [facility] and check labs tomorrow, if needed.        **6\. Others**    Notes: Over 45 minutes was spent with the patient.    Electronically signed by Azucena Fallen , APGNP on 05/02/2017 at 07:03 PM  EDT    Sign off status: Completed        * * [facility]    [address]    [place], [place]  [address]    Tel: [phone]    Fax: [phone]              * * *          Patient: Angela [name], Angela [name] DOB: 04-13-1991 Progress Note: Angela Vincenza Dail,  APGNP. 04/06/2017    ---    Note generated by eClinicalWorks EMR/PM Software ([email or web address])

## 2020-11-17 NOTE — Progress Notes (Signed)
* * *        **  [name]**    --- ---    51 Y old Female, DOB: May 01, 1991    [address] [place], [place], [name] [address]    Home: [phone]    Provider: Azucena Fallen        * * *    Telephone Encounter    ---    Answered by   Azucena Fallen  Date: 05/02/2017         Time: 02:33 PM    Reason   FMLA forms    --- ---            Message                      FMLA forms completed per patient/patient's mother's request                 Action Taken   Damaso Laday,[name] 05/02/2017 2:33:52 PM > To Jennie B.  Bugler,Jennifer 05/02/2017 3:21:09 PM > Left message form is ready to be picked  up or if she needs it faxed to call us. Bugler,Jennifer 05/04/2017 11:39:48 AM  > Form was picked up yesterday.                * * *                ---          * * *          Patient: Angela [name], Angela [name] DOB: 1991/01/31 Provider: Azucena Fallen  05/02/2017    ---    Note generated by eClinicalWorks EMR/PM Software ([email or web address])

## 2020-11-17 NOTE — Progress Notes (Signed)
* * *        **  Angela [name]**    --- ---    89 Y old Female, DOB: Jul 18, 1991    [address] [place], [place], [name] [address]    Home: [phone]    Provider: Howell Pringle        * * *    Telephone Encounter    ---    Answered by   Catha Nottingham  Date: 03/30/2017         Time: 01:58 PM    Reason   PT OT Orders    --- ---            Action Taken   [facility] 03/30/2017 1:59:55 PM > PT OT Orders faxed                * * *              * * *        ---        Reason for Appointment    ---      1\. PT OT Orders    ---      Allergies    ---      Tetracycline HCl    ---    Neurontin: nausea: Side Effects    ---      Assessments    ---    1\. Fibromyalgia - M79.7    ---      Treatment    ---       **1\. Fibromyalgia**    _LAB: Physical Therapy eval and Tx (Ordered for 03/30/2017)_      [facility] 03/30/2017 1:58:59 PM > To be performed by a pain Specialist    --- ---        _LAB: Occupational Therapy eval and Tx (Ordered for 03/30/2017)_    [facility] 03/30/2017 1:59:11 PM > To be performed by a pain Specialist    --- ---          * * *          Patient: Angela [name] DOB: 05-26-1991 Provider: Howell Pringle  03/30/2017    ---    Note generated by eClinicalWorks EMR/PM Software ([email or web address])

## 2020-11-17 NOTE — Progress Notes (Signed)
* * *        **  [name]**    --- ---    80 Y old Female, DOB: 21-May-1991    [address] [place], [place], [name] [address]    Home: [phone]    Provider: Azucena Fallen        * * *    Telephone Encounter    ---    Answered by   Azucena Fallen  Date: 03/29/2017         Time: 05:17 PM    Reason   Pain update    --- ---            Message                      Patient's mother came to the office to pick up the Norco prescription this morning.  Concerned about patient's pain level and requested I call her back.                  Action Taken   Jye Fariss,[name] 03/29/2017 5:19:38 PM > I spoke to patient's  mother. Started Lyrica. Patient will be seen at the [facility] in  9/18. Deborha Moseley,[name] 03/29/2017 5:26:50 PM > Patient's mother states the  patient is continuing with myofascial release, to start acupuncture next week.  Ellen Henri has Psychiatry, as well. Janella Rogala,[name] 03/29/2017 5:31:23 PM >  Advised patient's mother I won't increase Norco dose at this time; she  verbalizes understanding. Advised that if patient's pain is unbearable, she  should be seen in ED. Patient's mother or patient will call with an update on  03/31/17.                * * *                ---          * * *          Patient: Joellyn, Grandt DOB: 03-17-1991 Provider: Azucena Fallen  03/29/2017    ---    Note generated by eClinicalWorks EMR/PM Software ([email or web address])

## 2020-11-17 NOTE — Progress Notes (Signed)
* * *        **Angela [name]    --- ---    35 Y old Female, DOB: [birth year]    Account Number: [id number]    [address] [place], [place]    Home: [phone]    Guarantor: Angela [name] Insurance: [employer] Payer ID: PAPER    PCP: Howell Pringle    Appointment Facility: [facility]        * * *    07/28/2016  Progress Notes: Angela [name], APGNP.    --- ---    ---        Current Medications    ---    Taking     * NovoLog 100 UNIT/ML Solution Subcutaneous     ---    * Lantus 100 UNIT/ML Solution 25 units Subcutaneous at bedtime    ---    Discontinued    * Duloxetine HCl N/A Capsule Delayed Release Particles , Notes: Patient is not sure of dosage    ---    * Medication List reviewed and reconciled with the patient    ---      Active Problem List    ---      E10.9      Type 1 diabetes        --- ---    M79.7      Fibromyalgia        --- ---    R53.83      Fatigue        --- ---    Z00.00      Routine adult health maintenance        --- ---    F41.0      Severe anxiety with panic        --- ---      Family History    ---      Father: alive, diagnosed with Hypertension    ---    Mother: alive, diagnosed with Hypertension    ---      Social History    ---     _Quality 2017:_    no Smoking Are you a: never smoker.      Allergies    ---      Tetracycline HCl    ---      Review of Systems    ---    All other systems were reviewed and were negative except per HPI.        Reason for Appointment    ---      1\. Anxiety and depression.    ---      History of Present Illness    ---     _._ :    The patient presents today for an acute visit due to worsening anxiety and  depression. The patient reports she moved to [facility] two months ago and  has had increase in both depression and anxiety since then. She is having a  difficult time at her new job due to her boss. She does feel a little bit  isolated as well. The patient reports decreased appetite. She denies  any  suicidal or homicidal ideations. The patient had such severe anxiety that she  did fly home from [place] to be with her family. She does plan on starting  therapy once she returns to [facility] next week.    The patient stopped Duloxetine a few months ago, which was  prescribed due to  fibromyalgia. She did not think it was helping with her mood and she has had  no increase in the fibromyalgia pain since stopping that.    The patient is a type 1 diabetic and is followed by [facility]  Endocrinology. She reports her blood sugars have been under good control.      Vital Signs    ---    Wt 132, Ht 68, BMI 20.07, HR 90, BP 110/70.      Physical Examination    ---     _GENERAL_ :    Appears stated as: yes. Build: normal . Eye contact: normal. General  Appearance: no acute distress. Hygiene: good. Ill-appearance: none. Mental  Status: alert and oriented. Mood/Affect: pleasant, sad, tearful, anxious.  Race: caucasion. Speech: clear.    _HEENT_ :    Eyes: non-icteric sclera, conjunctiva and sclera slightly injected due to  crying. Head: atraumatic, normocephalic.    _NECK_ :    Jugular venous distension: none. Neck supple.    _LUNGS_ :    Auscultation: CTA bilaterally. Effort: no respiratory distress.    _HEART_ :    Edema: trace LLE edema. Heart sounds: normal. Murmurs: none. Rate: normal.  Rhythm: regular.    _ABDOMEN_ :    Tenderness: none.    _NEUROLOGICAL_ :    Cranial Nerves: CN's II-XII grossly intact. Gait: normal.    _SKIN_ :    Color: good. General: warm, moist.    _PSYCHOLOGY_ :    Insight: normal . Judgement: normal . Memory: good .          Assessments    ---    1\. Severe anxiety with panic - F41.0 (Primary)    ---    2\. 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 Type 1 diabetes - E10.9    ---    4\. Fibromyalgia - M79.7    ---      Treatment    ---       **1\. Severe anxiety with panic**    Start Xanax Tablet, 0.25 MG, 0.5 tablet as needed,  Orally, Twice a day, 30  days, 30 Tablet, Refills 0    Notes: Patient education provided regarding risk of dependence and sedating  effects of Xanax. Patient is aware that it is used short-term until SSRI  becomes effective.    ---        **2\. Current moderate episode of major depressive disorder without prior  episode**    Start Citalopram Hydrobromide Tablet, 10 mg, 0.5 tablet x 6 days, then 1  tablet, Orally, Once a day, 30 day(s), 30, Refills 1    Notes: 10 pound unintentional weight loss noted. Patient will be starting  therapy.        **3\. Type 1 diabetes**    Notes: Followed by [facility] Endocrinology.        **4\. Fibromyalgia**    Notes: No increase in pain since stopping Cymbalta.    Electronically signed by Azucena Fallen , APGNP on 08/04/2016 at 01:31 PM  EST    Sign off status: Completed        * * *        [facility]    [address]    [place], [place] [address]    Tel: [phone]    Fax: [phone]              * * *  Patient: Angela [name] DOB: [birth year] Progress Note: Angela [name],  APGNP. 07/28/2016    ---    Note generated by eClinicalWorks EMR/PM Software ([email or web address])

## 2020-11-17 NOTE — Progress Notes (Signed)
* * *        **  Angela [name]**    --- ---    56 Y old Female, DOB: 29-Mar-1991    [address] [place], [place], [name] [address]    Home: [phone]    Provider: Azucena Fallen        * * *    Telephone Encounter    ---    Answered by   Rico Ala  Date: 04/06/2017         Time: 02:11 PM    Reason   RX    --- ---            Message                      Patient father called and said that patient is taking 100mg  a day. 50MG  in the morning and 50mg  in the evening. Megan needed the Rx info.                Action Taken   [facility] 04/06/2017 2:14:53 PM > send to NP Megan  Aseret Hoffman,[name] 04/06/2017 2:46:32 PM > Patient is prescribed 25 MG TWICE A  DAY. Please tell patient/parents again to stop adjusting medications. Decrease  back to 25 mg twice a day.                * * *                ---          * * *          Patient: Angela [name], Angela [name] DOB: November 30, 1990 Provider: Azucena Fallen  04/06/2017    ---    Note generated by eClinicalWorks EMR/PM Software ([email or web address])

## 2020-11-17 NOTE — Progress Notes (Signed)
* * *        **  Angela [name]**    --- ---    81 Y old Female, DOB: [birth year]    [address] [place], [place], [name] [address]    Home: [phone]    Provider: Azucena Fallen        * * *    Telephone Encounter    ---    Answered by   Cheral Marker  Date: 04/28/2017         Time: 02:53 PM    Caller   Ingrid-Mother [phone]    --- ---            Reason   Midodrine Rx.            Message                      f/u per discussion this morning Mother states she had w/ NP, Megan. Wether to increase dose??      Tried all day to get a hold of someone from neuroendocrinology dept. from [facility].      What are Megan's thoughts on this because she seems to be getting less results on this dose.                      Action Taken   [facility] 04/28/2017 2:57:26 PM > NP to review Ibraham Levi,Megan-  Rose 04/28/2017 3:44:48 PM > What is the dose? Valeras,Amy 04/28/2017 4:00:06 PM >  Midodrine 5mg  TID- Carsen Machi,[name] 04/28/2017 4:06:30 PM > Can try increasing  to 10 mg in AM and leave other two doses the same. _update on Monday  Valeras,Amy 04/28/2017 5:27:10 PM > Pt. mother notified Valeras,Amy 04/28/2017  5:29:02 PM > Pt. mother notified            Refills  Increase Midodrine HCl Tablet, 5 MG, Orally, 120 Tablet, 2 tablets,  qam, 1 tablet at noon & 1 in the evening=Total of 20mg  daily, 30 days,  Refills=0    --- ---          * * *                ---          * * *          Patient: Angela [name] DOB: 1990-12-17 Provider: Azucena Fallen  04/28/2017    ---    Note generated by eClinicalWorks EMR/PM Software ([email or web address])

## 2020-11-17 NOTE — Progress Notes (Signed)
* * *        **  Angela [name]**    --- ---    44 Y old Female, DOB: 02/10/1991    [address] [place], [place], [name] [address]    Home: [phone]    Provider: Howell Pringle        * * *    Telephone Encounter    ---    Answered by   Cheral Marker  Date: 05/01/2017         Time: 04:59 PM    Reason   SIG change: Midodrine    --- ---            Action Taken   [facility] 05/01/2017 5:01:44 PM > Rx. has been changed in  eclinical per NP, Megan            Refills  Increase Midodrine HCl Tablet, 5 MG, Orally, 2 tablets, qam, 1 tablet  at noon & 1 in the evening=Total of 20mg  daily    --- ---          * * *                ---          * * *          Patient: Angela [name] DOB: 1991/07/21 Provider: Howell Pringle  05/01/2017    ---    Note generated by eClinicalWorks EMR/PM Software ([email or web address])

## 2020-11-17 NOTE — Progress Notes (Signed)
* * *        **Angela [name]    --- ---    53 Y old Female, DOB: 1991/01/23    Account Number: [id number]    [address] [place], [place]    Home: [phone]    Guarantor: Angela [name] Insurance: [employer] Payer ID: PAPER    PCP: Howell Pringle    Appointment Facility: [facility]        * * *    03/29/2016  Progress Notes: [name] Tomio Kirk, APGNP.    --- ---    ---        Current Medications    ---    Taking     * NovoLog 100 UNIT/ML Solution Subcutaneous     ---    * Lantus 100 UNIT/ML Solution 25 units Subcutaneous at bedtime    ---    * Duloxetine HCl N/A Capsule Delayed Release Particles , Notes: Patient is not sure of dosage    ---    * Medication List reviewed and reconciled with the patient    ---      Active Problem List    ---      E10.9      Type 1 diabetes        --- ---    M79.7      Fibromyalgia        --- ---    R53.83      Fatigue        --- ---    Z00.00      Routine adult health maintenance        --- ---      Surgical History    ---      Denies Past Surgical History    ---      Family History    ---      Father: alive, diagnosed with Hypertension    ---    Mother: alive, diagnosed with Hypertension    ---      Social History    ---     _Quality 2017:_    no Smoking Are you a: never smoker.    _Drug/Alcohol:_    no Drugs . Alcohol: yes Points 1, Interpretation Negative.    _Miscellaneous:_    Marital status: Single. Occupation: Social worker. no Children. Living with: parents.      Gyn History    ---    Periods : none due to Implanon.    Sexual activity not currently sexually active.    Last pap smear date approximately two years ago .    Abnormal pap smear denies.    Birth control Implanon.    GYN [facility].      OB History    ---    Total living children 0\.      Allergies    ---      N.K.D.A.    ---        Reason for Appointment    ---      1\. New patient    ---      History of Present Illness    ---     _._ :    Angela [name] is a new  patient to the practice. She has not had a PCP in years.    She is followed closely by Endocrinologist, Dr. Isaias Sakai, at [facility].    Patient reports her blood sugars have been in the 150  range recently.    She reports not feeling well since going on a difficult hike last week. She  had trouble coming down the mountain due to discomfort in her legs. She  vomited once. Patient also reports feeling tired and sweating more than usual.    Patient is planning on moving to [place] in the next two months.    ROS otherwise negative including for SOB, joint pain, fever, diarrhea,  constipation, or diarrhea.      Vital Signs    ---    Wt 142, Ht 68, BMI 21.59, HR 88, BP 100/70.      Physical Examination    ---     _GENERAL_ :    Appears stated as: yes. Build: normal . Eye contact: normal. General  Appearance: no acute distress. Hygiene: good. Ill-appearance: minimal. Mental  Status: alert and oriented. Mood/Affect: pleasant. Race: caucasion. Speech:  clear.    _HEENT_ :    Ears: no gross hearing deficits, external ear unremarkable, ear canals with  slight erythema, tympanic membranes normal bilaterally. Eyes: PERRLA, EOMI,  non-icteric sclera, conjunctiva clear. Head: atraumatic, normocephalic. Nose:  normal pink mucosa. Pharynx: mild erythema.    _NECK_ :    Cervical lymph nodes: anterior cervical lymphadenopathy, nontender, mobile,  soft, bilateral. Jugular venous distension: none. Neck supple. Thyroid: no  thyromegaly.    _LUNGS_ :    Auscultation: CTA bilaterally. Effort: no respiratory distress.    _HEART_ :    Edema: trace LLE edema. Heart sounds: normal. Murmurs: none. Rate: normal.  Rhythm: regular.    _ABDOMEN_ :    Bowel sounds: normoactive. Distention: none. General: normal. Tenderness:  none.    _EXTREMITIES_ :    Arthritic Deformity: none. Clubbing: none. Cyanosis: none. Pulses: 1+, left,  dorsalis pedis, otherwise 2+.    _NEUROLOGICAL_ :    Cranial Nerves: CN's II-XII grossly intact. Gait: normal.     _GENITOURINARY - FEMALE_ :    Exam deferred per patient's preference, to gynecologist.    _SKIN_ :    Color: flushed. General: warm, diaphoretic at times. Skin Lesion(s): LLE with  scabs from insect bites.    _BACK_ :    General: normal spinal curvature. Lower back pain: no .    _BREASTS_ :    Exam deferred per patient's perference, to gynecologist.          Assessments    ---    1\. Type 1 diabetes - E10.9 (Primary)    ---    2\. Fibromyalgia - M79.7    ---    3\. Fatigue - R53.83    ---    4\. Routine adult health maintenance - Z00.00    ---      Treatment    ---       **1\. Type 1 diabetes**    Notes: Continue with Endocrinology at [facility]; patient will schedule an  appointment with an Endocrinologist in [place] when she moves.    ---        **2\. Fibromyalgia**    Notes: Patient will call with dose of Cymbalta.        **3\. Fatigue**    _LAB: CBC (INCLUDES DIFF/PLT)_    _LAB: LYME AB SCREEN_    Notes: Most likely related to viral illness. Patient will call if not feeling  better in the next few days. Continue with rest and fluids.        **4\. Routine adult health maintenance**    Notes: Patient will schedule clinical  breast exam, pelvic exam, and Pap test  with GYN. Patient has labs done at Endocrinologist.    Electronically signed by Azucena Fallen , APGNP on 03/29/2016 at 04:56 PM  EDT    Sign off status: Completed        * * *        [facility]    [address]    [place], [place] [address]    Tel: [phone]    Fax: [phone]              * * *          Patient: Angela [name], Angela [name] DOB: Feb 12, 1991 Progress Note: [name] Deshundra Waller,  APGNP. 03/29/2016    ---    Note generated by eClinicalWorks EMR/PM Software ([email or web address])

## 2020-11-17 NOTE — Progress Notes (Signed)
* * *        **  Angela [name]**    --- ---    3 Y old Female, DOB: Mar 08, 1991    [address] [place], [place], [name] [address]    Home: [phone]    Provider: Azucena Fallen        * * *    Telephone Encounter    ---    Answered by   Azucena Fallen  Date: 04/07/2017         Time: 12:53 PM    Reason   Follow-up appointment    --- ---            Message                      Needs follow-up on 8/31.        Will be "due" for Norco prescription that day and can pick up after appointment.                  Action Taken   [name],[name] 04/07/2017 12:54:28 PM > To Jennie B.  [name],[name] 04/11/2017 11:54:45 AM > LM for pt to call me back to schedule  appointment [name],[name] 04/12/2017 2:48:26 PM > LM for pt to call back an  schedule appointment with [name] [name],[name] 04/12/2017 4:41:40 PM >  patients mother made follow up with [name] for 8/31                * * *                ---          * * *          Patient: Angela [name] DOB: 07-May-1991 Provider: Azucena Fallen  04/07/2017    ---    Note generated by eClinicalWorks EMR/PM Software ([email or web address])

## 2020-11-17 NOTE — Progress Notes (Signed)
* * *        **[name]    --- ---    70 Y old Female, DOB: [birth year]    Account Number: [id number]    [address] [place], [place]    Home: [phone]    Guarantor: [name] Insurance: [employer] Payer ID: [id number]    PCP: Howell Pringle    Appointment Facility: [facility]        * * *    04/07/2017  Progress Notes: Angela [name], APGNP.    --- ---    ---        Current Medications    ---    Taking     * MetFORMIN HCl ER 500 mg Tablet Extended Release 24 Hour 1 tablet Orally twice a day (bid)    ---    * Lyrica 25 MG Capsule 1 capsule Orally Twice a day    ---    * Duloxetine HCl 60 MG Capsule Delayed Release Particles 1 capsule Orally Once a day    ---    * NovoLog 100 UNIT/ML Solution Subcutaneous     ---    * Lantus 100 UNIT/ML Solution 25 units Subcutaneous at bedtime    ---    * OneTouch Verio Flex System w/Device Kit USE METER 3 TO 4 TIMES A DAY     ---    * Nitrofurantoin Monohyd Macro 100 MG Capsule 1 capsule with food Orally every 12 hrs    ---    * Hydrocodone-Acetaminophen 10-325 MG Tablet 1 tablet as needed Orally three times a day (tid), stop date 04/21/2017    ---    * Advil 200 MG Tablet 1 tablet with food or milk as needed Orally Three times a day    ---    * Medication List reviewed and reconciled with the patient    ---      Past Medical History    ---       No Medical History.Marland Kitchen        ---      Surgical History    ---      No Surgical History documented.    ---      Family History    ---      Father: alive, diagnosed with Hypertension    ---    Mother: alive, diagnosed with Hypertension    ---      Social History    ---     Sharion Settler 2017:_    Post-Discharge Med Rec Medication Reconciliation: Reconciled current and  discharge medications. no Smoking Are you a: never smoker. Smoking  Cessation/Exceptions Smoking Status Reviewed: 04/07/2017. Fall Risk Assessment  Fall Risk Assessment: No falls in the past year. BMI Follow-up Plan BMI  LAST  COMPLETED: 04/07/2017 Normal BMI.      Allergies    ---      Tetracycline HCl    ---    Neurontin: nausea: Side Effects    ---      Hospitalization/Major Diagnostic Procedure    ---      Pain, UTI-[facility] ED 03/2017    ---      Review of Systems    ---    All other systems were reviewed and were negative except per the history of  present illness.        Reason for Appointment    ---      1\. Follow-up visit    ---  History of Present Illness    ---     _*._ :    The patient presents today for a followup visit. She is accompanied to today's  visit by her mother and father, who are very involved and supportive in the  patient's care.    The patient was seen yesterday and her blood pressure was noted to be very low  at 90/80 initially and then repeat blood pressure was 90/70. The patient was  also tachycardic. The patient's father called the office yesterday afternoon  confirming that the Lyrica dose that they have been administering to the  patient was 50 mg twice a day. The patient is prescribed 25 mg twice a day.  Nursing placed a phone call to the patient advising her not to take 50 mg  twice a day and the patient and parents confirmed today have reduced the  medication back to 25 mg twice a day.    The patient reports that she was feeling better than yesterday. She has been  increasing her fluids as advised.    The patient does still report pain, especially in her legs from the knees and  below.    She does notice increased sweating, especially on her abdomen and back. The  patient continues on Macrobid for UTI. The patient reports that her hemoglobin  A1c was checked in July at [facility] in [place]. She states that  she was not told what the number was, but that was very bad. She expects it to  be better now because she is closely monitoring her capillary blood sugars and  she is agreement with checking at the end of this month.    The patient's mother request the patient be checked for  tick-borne illnesses  because of the fatigue and pain in case it is not just fibromyalgia flare. The  patient's father had a diagnosis of Lyme disease per report.    The patient also reports that she was told she had enlarged thyroid in  [place], but never went for thyroid ultrasound and she will be interested  in having this done. The patient does still have an elevated heart rate.  Apical pulse is 120. She reports occasional chest pain, which she attributes  to anxiety. She denies palpitations or shortness of breath.      Vital Signs    ---    Wt 133, Ht 68, BMI 20.22, Temp 97.1-Tympanic, HR 117, BP 110/80, RR 14.      Physical Examination    ---     _GENERAL_ :    Appears stated as: yes. Build: normal . Eye contact: normal. General  Appearance: appears less uncomfortable and stiff. Hygiene: good. Ill-  appearance: none. Mental Status: alert and oriented. Mood/Affect: less sad,  tearful, and anxious. Race: caucasion. Speech: clear.    _HEENT_ :    Eyes: non-icteric sclera, conjunctiva clear. Head: atraumatic, normocephalic.    _NECK_ :    Jugular venous distension: none. Neck supple.    _LUNGS_ :    Auscultation: CTA bilaterally. Effort: no respiratory distress.    _HEART_ :    Edema: trace LLE edema. Heart sounds: normal. Murmurs: none. Rate:  tachycardic-120. Rhythm: regular.    _NEUROLOGICAL_ :    Cranial Nerves: CN's II-XII grossly intact. Gait: slow.    _SKIN_ :    Color: color is better today. General: warm, moist.    _PSYCHOLOGY_ :    Memory: good .    _BACK_ :  Lower back pain: yes; improved. Spine: normal spine curvature.    Discomfort with any palpation of back, arms, and legs.      Assessments    ---    1\. Myalgia - M79.1 (Primary)    ---    2\. Thyromegaly - E01.0    ---    3\. Tachycardia - R00.0    ---    4\. Fibromyalgia - M79.7    ---    5\. Type 1 diabetes - E10.9    ---    6\. Severe anxiety with panic - F41.0    ---    7\. Other specified hypotension - I95.89    ---    8\. Urinary tract  infection without hematuria, site unspecified - N39.0    ---      Treatment    ---       **1\. Myalgia**    _LAB: LYME AB SCREEN_    _LAB: ANAPLASMA PHAGOCYTOPHILUM AB (IGG,IGM)_    _LAB: BABESIA MICROTI (IGG,IGM)_    Notes: We will check for tick-borne illnesses.    ---        **2\. Thyromegaly**    _IMAGING: Ultrasound : Neck_    Notes: We will check neck ultrasound.        **3\. Tachycardia**    _IMAGING: EKG-Electrocardiogram_    Notes: We will check an EKG. We will hold off on a beta-blocker for now due to  episodes of hypotension.        **4\. Fibromyalgia**    Notes: No change in medications for this. The patient and parents are aware  not to adjust any other medications without checking provider in this office.  The patient will be due for refill of Norco on 04/21/2017, advised the patient  and her parents to schedule an appointment for that day and she could obtain  the prescription at that same time. They verbalized understanding.        **5\. Type 1 diabetes**    Notes: Check A1c and urine for microalbumin at end of this month.        **6\. Severe anxiety with panic**    Notes: Advised the patient not to take Xanax unless she is actually having  panic attack due to the hypotension.        **7\. Other specified hypotension**    Notes: Improved. Continue with increased fluids. Follow blood pressures; has  arm BP cuff at home.        **8\. Urinary tract infection without hematuria, site unspecified**    Notes: Second UTI in 2 months; first one was treated with Bactrim. Repeat UA  C+S after Macrobid is completed.    Electronically signed by Azucena Fallen , APGNP on 05/02/2017 at 07:03 PM  EDT    Sign off status: Completed        * * [facility]    [address]    [place], [place] [address]    Tel: [phone]    Fax: [phone]              * * *          Patient: Angela [name], Angela [name] DOB: [birth year] Progress Note: Angela [name],  APGNP. 04/07/2017    ---    Note  generated by eClinicalWorks EMR/PM Software ([email or web address])

## 2020-11-17 NOTE — Progress Notes (Signed)
* * *        **  Angela [name]**    --- ---    2 Y old Female, DOB: [birth year]    [address] [place], [place], [name] [address]    Home: [phone]    Provider: Joesph July        * * *    Telephone Encounter    ---    Answered by   Cheral Marker  Date: 04/27/2017         Time: 12:17 PM    Caller   Willow Ora [phone]    --- ---            Reason   Lyrica refill            Message                      Pt. took last dose of Lyrica 25mg , 1 tab BID, Requesting refill and knows Pt. has appt. tomorrow w/ NP.                Action Taken   [facility] 04/27/2017 12:51:49 PM > [name], Wait to fill until  tomorrow at appt? [name] Comeiro 04/27/2017 02:26:18 PM>Okay to fill  today. #60, no refills. Thank you Valeras,Amy 04/27/2017 2:30:40 PM > Printed  for MD Casmir Auguste to sign-            Refills  Refill Lyrica Capsule, 25 MG, Orally, 60 Capsule, 1 capsule, Twice a  day, 30 days, Refills=0    --- ---          * * *                ---          * * *          Patient: Angela [name] DOB: 1990/11/11 Provider: Joesph July  04/27/2017    ---    Note generated by eClinicalWorks EMR/PM Software ([email or web address])

## 2020-11-17 NOTE — Progress Notes (Signed)
* * *        **  Angela [name]**    --- ---    47 Y old Female, DOB: Oct 31, 1990    [address] [place], [place], [name] [address]    Home: [phone]    Provider: Azucena Fallen        * * *    Telephone Encounter    ---    Answered by   [name]  Date: 04/19/2017         Time: 04:08 PM    Caller   Jerene Dilling    --- ---            Reason   update just FYI            Action Taken   [name],[name] 04/19/2017 4:11:05 PM > Jerene Dilling called just to  give an update that Seward Grater was hospitalized at [facility] over the weekend.  She had a fall/faiting spell upon standing and hit her face. her BP was also  not stable according to [facility]. They expect her to be released from [facility] tomorrow and she will bring her discharge summary to her appointment  Friday. Carlyne Keehan,[name] 04/21/2017 8:59:44 AM > Noted                * * *                ---          * * *          Patient: Angela [name] DOB: [birth year] Provider: Azucena Fallen  04/19/2017    ---    Note generated by eClinicalWorks EMR/PM Software ([email or web address])

## 2020-11-17 NOTE — Progress Notes (Signed)
* * *        **  Angela [name]**    --- ---    42 Y old Female, DOB: 12-25-1990    [address] [place], [place], [name] [address]    Home: [phone]    Provider: Azucena Fallen        * * *    Telephone Encounter    ---    Answered by   Baruch Goldmann  Date: 04/25/2017         Time: 11:54 AM    Reason   Appt    --- ---            Message                      Patient's mother called and LM stating she was told she would see [name] this week and needs an appt.                Action Taken   Benoit,Heather 04/25/2017 11:55:16 AM > will leave for Megan-  there aren't appts? Benoit,Heather 04/25/2017 11:55:58 AM > Mother [facility]  number is [phone] Maliea Grandmaison,[name] 04/25/2017 2:02:58 PM > ? put on  cancellation list? Benoit,Heather 04/25/2017 3:06:14 PM > called and LM  Benoit,Heather 04/25/2017 3:33:01 PM > appt booked                * * *                ---          * * *          Patient: Angela [name] DOB: 1990/11/22 Provider: Azucena Fallen  04/25/2017    ---    Note generated by eClinicalWorks EMR/PM Software ([email or web address])

## 2020-11-17 NOTE — Progress Notes (Signed)
* * *        **Angela [name]    --- ---    27 Y old Female, DOB: 1991-08-12    Account Number: [id number]    [address] [place], [place]    Home: [phone]    Guarantor: Angela [name] Insurance: [employer]emnity Payer ID: [id number]    PCP: Howell Pringle    Appointment Facility: [facility]        * * *    03/28/2017  Progress Notes: [name] Raynor Calcaterra, APGNP.    --- ---    ---        Current Medications    ---    Taking     * NovoLog 100 UNIT/ML Solution Subcutaneous     ---    * Lantus 100 UNIT/ML Solution 25 units Subcutaneous at bedtime    ---    * Duloxetine HCl 60 MG Capsule Delayed Release Particles 1 capsule Orally once a day    ---    * MetFORMIN HCl ER 500 mg Tablet Extended Release 24 Hour 1 tablet Orally twice a day (bid)    ---    * OneTouch Verio Flex System w/Device Kit USE METER 3 TO 4 TIMES A DAY     ---    Not-Taking/PRN    * Xanax 0.25 MG Tablet 0.5 tablet as needed Orally Twice a day    ---    Discontinued    * Zolpidem Tartrate 5 MG Tablet (Schedule IV Drug) TAKE 1 TABLET BY MOUTH AT BEDTIME AS NEEDED FOR SLEEP Oral     ---    * Hydrocodone-Acetaminophen 10-325 MG Tablet (Schedule II Drug) TAKE 1 TABLET BY MOUTH 3 TIMES A DAY AS NEEDED Oral     ---    * Citalopram Hydrobromide 10 mg Tablet 0.5 tablet x 6 days, then 1 tablet Orally Once a day    ---    * Medication List reviewed and reconciled with the patient    ---      Past Medical History    ---       No Medical History.Marland Kitchen        ---      Surgical History    ---      No Surgical History documented.    ---      Family History    ---      Father: alive, diagnosed with Hypertension    ---    Mother: alive, diagnosed with Hypertension    ---      Social History    ---     _Quality 2017:_    no Smoking Are you a: never smoker. Smoking Cessation/Exceptions Smoking  Status Reviewed: 03/28/2017. Fall Risk Assessment Fall Risk Assessment: No  falls in the past year. BMI Follow-up Plan BMI LAST COMPLETED:  03/28/2017  Normal BMI.    _Drug/Alcohol:_    no Drugs . Alcohol: yes Did you have a drink containing alcohol in the past  year? Yes, How often did you have a drink containing alcohol in the past year?  Monthly or less (1 point), How many drinks did you have on a typical day when  you were drinking in the past year? 1 or 2 (0 points), How often did you have  six or more drinks on one occasion in the past year? Never (0 points), Points  1, Interpretation Negative.      Allergies    ---  Tetracycline HCl    ---    Neurontin: nausea: Side Effects    ---      Hospitalization/Major Diagnostic Procedure    ---      No Hospitalization History.    ---      Review of Systems    ---    All other systems were reviewed and were negative except per the history of  present illness.        Reason for Appointment    ---      1\. Fibromylagia    ---      History of Present Illness    ---     _Depression Screening_ :    PHQ-2 Little interest or pleasure in doing things No, Feeling down, depressed,  or hopeless Yes. PHQ-9 Thoughts that you would be better off dead, or of  hurting yourself in some way? Not at all, Total Score: 21, Interpretation  Severe Depression.    The patient presents today for an acute visit. She is accompanied to today's  visit by her mother who assists with the history.    The patient's mother reports that they flew the patient back from [place]  two days ago because, "she was not doing well in [place]."    The patient reports that she has been having a fibromyalgia flare. She thinks  that the fibromyalgia flare was triggered by increased exercise. The patient  reports generalized widespread pain and stiffness.    The patient's mother reports that the patient has started to be treated with  myofascial release massage. They are also looking into having the patient  treated at [facility] because there is a [facility]  there.    The patient states that she cannot function during  the day and she cannot  sleep at night.    The patient also reports severe anxiety and depression.    The patient denies any suicidal thoughts or ideations. She also denies any  homicidal thoughts or ideations.    The patient has been followed by a physician in [place] who prescribed the  patient Ambien on 03/24/2017. The patient states that she took that once and  then stopped it because she found it ineffective.    The patient has been taking Duloxetine 60 mg daily for three weeks and has not  noticed a significant difference in the fibromyalgia pain as of yet.    The patient was prescribed #90 of Norco 10/325 mg (one month supply) on  03/07/2017 and the patient has none left. She states no other medication, such  as Tylenol or Ibuprofen, helps the pain except Norco.    The patient requests a refill of Metformin 500 mg twice a day.      Vital Signs    ---    Wt 139, Ht 68, BMI 21.13, Temp 97.9-Tympanic, HR 120, BP 110/76, RR 14, SaO2  98% (RA).      Physical Examination    ---     _GENERAL_ :    Appears stated as: yes. Build: normal . Eye contact: normal. General  Appearance: appears uncomfortable and stiff. Hygiene: good. Ill-appearance:  none. Mental Status: alert and oriented. Mood/Affect: sad, tearful, anxious.  Race: caucasion. Speech: clear.    _HEENT_ :    Eyes: non-icteric sclera. Head: atraumatic, normocephalic.    _NECK_ :    Jugular venous distension: none. Neck supple.    _LUNGS_ :    Auscultation: CTA bilaterally. Effort: no respiratory  distress.    _HEART_ :    Edema: trace LLE edema. Heart sounds: normal. Murmurs: none. Rate: normal.  Rhythm: regular.    _NEUROLOGICAL_ :    Cranial Nerves: CN's II-XII grossly intact. Gait: slow.    _SKIN_ :    Color: good. General: warm, moist.    _PSYCHOLOGY_ :    Memory: good .    Discomfort with any palpation of back, arms, and legs.      Assessments    ---    1\. Fibromyalgia - M79.7 (Primary)    ---    2\. Severe anxiety with panic - F41.0    ---    3\. Type  1 diabetes - E10.9    ---      Treatment    ---       **1\. Fibromyalgia**    Start Lyrica Capsule, 25 MG, 1 capsule, Orally, Twice a day, 30 days, 60  Capsule, Refills 0    Refill Duloxetine HCl Capsule Delayed Release Particles, 60 MG, 1 capsule,  Orally, Once a day, 30 days, 30 Capsule, Refills 1    Decrease Hydrocodone-Acetaminophen Tablet, 5-325 MG, 1 tablet as needed,  Orally, three times a day (tid), 10 days, 30, Refills 0    Notes: Agree with the patient being seen by a provider who specializes in  fibromyalgia. The patient has tried Neurontin in the past and did not feel it  was effective and also experienced nausea with it. We will try a low dose of  Lyrica. Patient and caregiver education provided that opioids are not  indicated for the treatment of fibromyalgia. She also overtook how much was  prescribed for her by her physician in [place]. I will prescribe a lower  dose of the Norco and follow the patient closely. She will follow-up in one  week. She verbalizes understanding.    ---        **2\. Severe anxiety with panic**    Refill Xanax Tablet, 0.25 MG, 0.5 tablet as needed, Orally, once a day, 30  days, 15 Tablet, Refills 0    Notes: Emotional support was provided. The patient and her mother are aware  that they should call the Crisis Line or go to the emergency department if the  patient experiences any suicidal or homicidal thoughts or ideations. They are  trying to establish care with a psychiatric provider. They are aware that  Xanax is used in the short-term for acute issues only.        **3\. Type 1 diabetes**    Refill MetFORMIN HCl ER Tablet Extended Release 24 Hour, 500 mg, 1 tablet,  Orally, twice a day (bid), 30 days, 60, Refills 1    Notes: The patient was followed by Endocrinology in [place]. Her previous  endocrinologist was Myles Rosenthal at [facility]. I will refill the  Metformin until she is seen by an endocrinologist.    Electronically signed by Azucena Fallen , APGNP  on 03/30/2017 at 04:50 PM  EDT    Sign off status: Completed        * * [facility]    [address]    [place], [place] [address]    Tel: [phone]    Fax: [phone]              * * *          Patient: Angela [name], Angela [name] DOB: [birth year] Progress Note: [name] Ashtan Girtman,  APGNP. 03/28/2017    ---    Note generated by eClinicalWorks EMR/PM Software ([email or web address])

## 2020-11-17 NOTE — Progress Notes (Signed)
* * *        **Angela [name]    --- ---    43 Y old Female, DOB: 11/14/1990    [address] [place], [place], [name] [address]    Home: [phone]    Provider: Azucena Fallen        * * *    Telephone Encounter    ---    Answered by   [name]  Date: 03/30/2017         Time: 12:28 PM    Caller   Jerene Dilling    --- ---            Reason   PT/OT eval order for [facility] called [facility] needs an order for PT/OT pain evaluation for [facility]. I have confirmed patient does not need referral and I am waiting for [facility] to call back to clarify what they need for an order from [name] as well as anything else they may require.  XContact at [place] is Val [phone]  fax [phone]                Action Taken   [name],[name] 03/30/2017 1:55:05 PM > Last office visit note. 1  Order Physical Therapy and 1 Order for Occupational Therapy "to be performed  by a pain specialist. Need MD to do the orders. [name],[name] 03/30/2017  1:55:37 PM > Dr Darrick Penna will write the orders for OT and PT today.  [name],[name] 03/30/2017 2:02:30 PM > orders signed and faxed to Val just  awaiting Megans note to be locked from 03/28/17 [name],[name] 03/31/2017  8:39:03 AM > progress note faxed this am [name],[name] 03/31/2017 8:39:20 AM  > [name] this is just FYI [name],[name] 03/31/2017 9:00:10 AM > Update on  Patient from mother Jerene Dilling. Yesterday was a better day took 10mg  of Vicodin at  7am, 5pm and at midnight to go to bed. She took again at 7am this morning. She  is sleeping mostly in am not evening. Continues to take Lyrica and Cymbalta  not using Xanax at all. She has increased Lyrica at 50 mg and will continue  that 50 mg am and 50 mg pm. She has appointments next week with [facility]  Monday - OT, Tuesday -PT and Wednesday the psychologist. She has app with MD  9/10 and on wait list to be moved up. Continue with myofascial release therapy  and that helps  especially the home therapy. She has appointments for these  therapies this week and next. Would like to talk with [name] about about  Vicodin she is doing 3 a day at 10mg  and hoping to drop doses but worried she  wont have enough until next Thursday when she sees [place]. I did let her know  that she would need to talk with [name] direct about this. She would only make  it through Saturday using 3 times a day. Kavin Weckwerth,[name] 03/31/2017 3:26:09  PM > I returned the patient's mother's call with Phillis Knack,  present. She states that patient seemed "a little better yesterday." The  patient has been having myofascial massages. She has an OT appointment on  04/03/17, a PT appointment on 04/04/17, and a Psychiatry appointment on  04/05/17-all at [facility]. I advised the patient's mother that she  should not be adjusting the  patient's medications without discussing it with  me first. I also advised that I will not fill any of the medications early as  I already did for the Norco because the patient had run out of the Norco that  she was prescribed from the physician in [place]. If the patient is  experiencing intractable pain, the patient will go to the ED. She verbalizes  understanding.                * * *                ---          * * *          Patient: Angela [name], Angela [name] DOB: [birth year] Provider: Azucena Fallen  03/30/2017    ---    Note generated by eClinicalWorks EMR/PM Software (www.eClinicalWorks.com)

## 2021-01-14 ENCOUNTER — Ambulatory Visit
Admit: 2021-01-14 | Discharge: 2021-01-14 | Payer: PRIVATE HEALTH INSURANCE | Attending: Internal Medicine | Primary: Internal Medicine

## 2021-01-14 ENCOUNTER — Ambulatory Visit: Attending: Internal Medicine | Primary: Internal Medicine

## 2021-01-14 DIAGNOSIS — E1043 Type 1 diabetes mellitus with diabetic autonomic (poly)neuropathy: Secondary | ICD-10-CM

## 2021-01-14 LAB — AMB POC HEMOGLOBIN A1C: Hemoglobin A1c (POC): 5.7 %

## 2021-01-14 MED ORDER — INSULIN LISPRO 100 UNIT/ML (3 ML) SUB-Q PEN
100 unit/mL | SUBCUTANEOUS | 3 refills | Status: AC
Start: 2021-01-14 — End: ?

## 2021-01-14 MED ORDER — KETONE URINE TEST STRIPS
ORAL_STRIP | 1 refills | Status: AC
Start: 2021-01-14 — End: ?

## 2021-01-14 MED ORDER — INSULIN GLARGINE-YFGN (U-100) 100 UNIT/ML (3 ML) SUBCUTANEOUS PEN
100 unit/mL (3 mL) | PEN_INJECTOR | SUBCUTANEOUS | 3 refills | Status: AC
Start: 2021-01-14 — End: ?

## 2021-01-14 NOTE — Progress Notes (Signed)
Progress Notes by Bobbye Riggs, MD at 01/14/21 1400                Author: Bobbye Riggs, MD  Service: --  Author Type: Physician       Filed: 01/17/21 0825  Encounter Date: 01/14/2021  Status: Signed          Editor: Bobbye Riggs, MD (Physician)                  30 y.o. female here to establish  care for of type 1  diabetes.    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 No longer withhold insulin to control weight      CGMS se dexcom clarity bellow                                 Interpretation of UDJ:SHFWY sugars reviewed via  Dexcom from 5/13- 01/14/2021 Average blood sugars is 102 Time in target is 97.4%.(goal > 70%). Hypoglycemia is infrquent  with 2.6% of blood sugars < 70 Patterns:.Most blood sugars in target range. No  significant postprandial hyperglycemia.Blood sugars trending down overnight.      Diabetes related complications   Retinopathy: +    Nephropathy:? autominc neuropathy-+ gustatory hyperhidrosis   Neuropathy:+ numbness   Macrovascular:no     Key Antihyperglycemic Medications                             insulin glargine-yfgn (Semglee,insulin glarg-yfgn,Pen) 100 unit/mL (3 mL) inpn  (Taking)  Use up to 22 units every day  Indications: type 1 diabetes mellitus           insulin lispro (HumaLOG KwikPen Insulin) 100 unit/mL kwikpen  (Taking)  Use up to at otal dose of 30 units every day                Key CAD CHF Meds           Patient is on no cardiovascular meds.             INSULIN SAFETY           Carbohydrate source to treat hypoglycemia  glucagon  Medical alert   Rotates injection sites          ok    advised  ok        ROS   Chest pain No   SOB No   Claudication No   Neuropathy No   Change in visions No   Orthostatic symptoms not assessed      Up to date  with dental care not assessed      Dietary History:   Breakfast greek yogurt/honey/ chia seeds   Lunch  Salad/ chicken / sweet pototoe   Dinner --    Exercise:not assessed        Past Medical History:        Diagnosis  Date         ?  Diabetes 1.5, managed as type 1 (HCC)           ?  Fibromyalgia            Family History  Problem  Relation  Age of Onset          ?  Atrial Fibrillation  Mother            ?  Other  Father               Current Outpatient Medications          Medication  Sig  Dispense  Refill           ?  Dexcom G6 Sensor devi  CHANGE EVERY 10 DAYS         ?  Dexcom G6 Transmitter devi  DEVICE FOR CONTINUOUS GLUCOSE MONITORING CHANGE EVERY 3 MONTHS               ?  Acetone, Urine, Test (Ketone Urine Test) strip  Check for blood sugar over 250, nausea , vomiting or abdominal pain  50 Strip  1           ?  insulin glargine-yfgn (Semglee,insulin glarg-yfgn,Pen) 100 unit/mL (3 mL) inpn  Use up to 22 units every day  Indications: type 1 diabetes mellitus  15 Pen  3           ?  insulin lispro (HumaLOG KwikPen Insulin) 100 unit/mL kwikpen  Use up to at otal dose of 30 units every day  15 Adjustable Dose Pre-filled Pen Syringe  3        lantus 22 units every day   ISF  1: 50   I:CHO 1:12     Lab Results         Component  Value  Date/Time            Hemoglobin A1c  5.6  03/27/2019 06:20 AM            Hemoglobin A1c (POC)  5.7  01/14/2021 02:11 PM          No results found for: CHOL, CHOLPOCT, CHOLX, CHLST, CHOLV, HDL, HDLP, LDL, LDLC, DLDLP, TGLX, TRIGL, TRIGP, TGLPOCT, NTGLT, CHHD, CHHDX   No results found for: MCACR, MCA1, MCA2, MCA3, MCAU, MCAU2, MCALPOCT   Visit Vitals      BP  108/70 (BP 1 Location: Left upper arm, BP Patient Position: Sitting, BP Cuff Size: Adult)     Pulse  80     Resp  18     Ht  5\' 8"  (1.727 m)        BMI  24.33 kg/m??           General:  Well appearing in no acute distress   HEENT:  Head is atraumatic normocephalic neck is supple there is no cervical lymphadenopathy no  palpable goiter or nodules   Lungs:  Clear to auscultation   Cardiac:  Regular rate and rhythm normal S1-S2 without murmur or gallop   Dermatologic exam without suspicious lesions   Neurological exam:  Affect is bright there is no tremor deep tender reflexes are 2+ in relaxation phase the gait is normal    Diabetic foot exam:      Left Foot:    Visual Exam: normal     Pulse DP: 2+ (normal)    Filament test: normal sensation     Vibratory sensation: normal        Right Foot:    Visual Exam: normal     Pulse DP: 2+ (normal)    Filament test: normal sensation     Vibratory sensation: normal  Diagnoses and all orders for this visit:      1. 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 Currently under excellent control.Average blood sugars is 102 Time in target is 97.4%.(goal > 70%).  Hypoglycemia is infrquent with 2.6% of blood sugars < 70 Patterns:.Most blood sugars in target range. No significant postprandial hyperglycemia.Blood  sugars trending down over niht Risks of tight control and hypoglycemia reviewed.    Plan    - decrease glargine from 22 to 20 units    - discussed benefits of a closed loop insulin pump(tandem or omnipod)    - DKA prevention reviewed    - need for planned pregnancy and tight control prior to and during any future pregnancies         Orders:   -     AMB POC HEMOGLOBIN A1C   -     Acetone, Urine, Test (Ketone Urine Test) strip; Check for blood sugar over 250, nausea , vomiting or abdominal pain   -     METABOLIC PANEL, BASIC; Future   -     MICROALBUMIN, UR, RAND; Future   -     TSH 3RD GENERATION; Future   -     T4, FREE; Future   -     insulin glargine-yfgn (Semglee,insulin glarg-yfgn,Pen) 100 unit/mL (3 mL) inpn; Use up to 22 units every day  Indications: type 1 diabetes mellitus   -     insulin lispro (HumaLOG KwikPen Insulin) 100 unit/mL kwikpen;  Use up to at otal dose of 30 units every day      2. Nephropathy screen   Assessment & Plan:   Last micro albumin was negative on 09/09/2019..            3. Stage 3a chronic kidney disease (CKD) (HCC)   Assessment & Plan:   GFR= 47. No significant proteinuria. States that she has an upcoming appointment with nephrology. BP in an target range.      Level of Service based on time: I personally spent a total of  60 minutes on 01/14/2021 visit doing chart preparation,reviewing  previous records and labs,performing medically necessary evaluation, counseling and educating the patient as well as documenting the clinical information in the electronic medical records. This does not include time devoted to CGMS interpretation/ CGMS  interpretation is billed as a separate service        Orders Placed This Encounter        ?  GLUCOSE MONITOR, 72 HOUR, PHYS INTERP     ?  METABOLIC PANEL, BASIC              Standing Status:    Future         Standing Expiration Date:    01/15/2022        ?  MICROALBUMIN, UR, RAND              Standing Status:    Future         Standing Expiration Date:    01/15/2022        ?  TSH 3RD GENERATION              Standing Status:    Future         Standing Expiration Date:    01/14/2022        ?  T4, FREE  Standing Status:    Future         Standing Expiration Date:    01/14/2022        ?  AMB POC HEMOGLOBIN A1C     ?  Acetone, Urine, Test (Ketone Urine Test) strip             Sig: Check for blood sugar over 250, nausea , vomiting or abdominal pain         Dispense:  50 Strip         Refill:  1        ?  insulin glargine-yfgn (Semglee,insulin glarg-yfgn,Pen) 100 unit/mL (3 mL) inpn             Sig: Use up to 22 units every day  Indications: type 1 diabetes mellitus         Dispense:  15 Pen         Refill:  3        ?  insulin lispro (HumaLOG KwikPen Insulin) 100 unit/mL kwikpen             Sig: Use up to at otal dose of 30 units every day         Dispense:  15 Adjustable Dose Pre-filled Pen  Syringe             Refill:  3

## 2021-01-16 DIAGNOSIS — E1043 Type 1 diabetes mellitus with diabetic autonomic (poly)neuropathy: Secondary | ICD-10-CM

## 2021-01-16 NOTE — Assessment & Plan Note (Signed)
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 Discussed concerns regarding dementia with long term anticholinergic therapy. Discussed the option of topical glycopyrrolate(qbreza) as well as botox and sympathectomy. Referred her to the International hyperhidrosis Society web site.

## 2021-01-16 NOTE — Assessment & Plan Note (Signed)
Longstanding type 1 diabetes with a prior history of poor glycemic control. Currently under excellent control.Average blood sugars is 102 Time in target is 97.4%.(goal > 70%). Hypoglycemia is infrquent with 2.6% of blood sugars < 70 Patterns:.Most blood sugars in target range. No significant postprandial hyperglycemia.Blood sugars trending down over niht Risks of tight control and hypoglycemia reviewed.   Plan   - decrease glargine from 22 to 20 units   - discussed benefits of a closed loop insulin pump(tandem or omnipod)   - DKA prevention reviewed   - need for planned pregnancy and tight control prior to and during any future pregnancies

## 2021-01-17 NOTE — Assessment & Plan Note (Signed)
Last micro albumin was negative on 09/09/2019.Marland Kitchen

## 2021-01-17 NOTE — Assessment & Plan Note (Signed)
GFR= 47. No significant proteinuria. States that she has an upcoming appointment with nephrology. BP in an target range.

## 2021-03-17 ENCOUNTER — Encounter

## 2021-03-18 NOTE — Telephone Encounter (Signed)
Telephone Encounter by Jill Side, LPN at 83/38/25 [id number]                Author: Jill Side, LPN  Service: --  Author Type: Licensed Nurse       Filed: 03/18/21 0911  Encounter Date: 03/17/2021  Status: Signed          Editor: Jill Side, LPN (Licensed Nurse)          From: Wylie Hail: Bobbye Riggs, MDSent: 03/17/2021  2:32 PM EDTSubject: Dexcom RefillHi there,I was hoping a could get a refill on the Dexcom  g6 sensors and transmitter.Thank you!Maggie

## 2021-03-18 NOTE — Telephone Encounter (Signed)
Last Visit:01/14/2021  Next Visit: 04/29/2021  Labs:  01/14/2021  Poc alc: 5.7

## 2021-03-20 MED ORDER — DEXCOM G6 TRANSMITTER DEVICE
3 refills | Status: AC
Start: 2021-03-20 — End: ?

## 2021-03-20 MED ORDER — DEXCOM G6 SENSOR DEVICE
3 refills | Status: AC
Start: 2021-03-20 — End: ?

## 2021-04-29 ENCOUNTER — Ambulatory Visit: Payer: PRIVATE HEALTH INSURANCE | Attending: Internal Medicine | Primary: Internal Medicine

## 2021-04-29 ENCOUNTER — Encounter

## 2021-07-13 ENCOUNTER — Telehealth

## 2021-07-13 NOTE — Telephone Encounter (Signed)
Patient called and wanted to speak with a nurse to go over her glucose monitor. She stated that she has a couple questions that she would like to ask. Please call her back when you can to go over this with her. Thank you.

## 2021-07-14 ENCOUNTER — Encounter

## 2021-07-14 MED ORDER — ACCU-CHEK GUIDE VI STRP
3 refills | Status: AC
Start: 2021-07-14 — End: ?

## 2021-07-14 NOTE — Telephone Encounter (Signed)
Orders Placed This Encounter    blood glucose test strips (ACCU-CHEK GUIDE) strip     Sig: Use to test blood glucose levels up to five times daily.     Dispense:  200 each     Refill:  3

## 2021-07-14 NOTE — Telephone Encounter (Signed)
PT had to buy a new meter and now needs test strips to go along with it.    Pt bought an Accu Check Guide and is asking for Accu Check Guide test strips  She tests 5 to 6 times daily on average

## 2021-07-14 NOTE — Telephone Encounter (Signed)
Return call to patient regarding intended message below. Patient states she had to purchase a new blood glucose meter she now needs a Rx for test strips sent to the pharmacy for the Accucheck guide. Patient states although she has her CGM she still checks 5-6 x daily. [facility] [address] [facility] [place] Rx request sent to provider for approval.    Last- 01/14/2021  Next- 11/25/2021  Labs- 03/27/2019   A1c - 5.6

## 2021-07-14 NOTE — Telephone Encounter (Signed)
Last Visit:05/26/202  Next Visit:11/25/2021

## 2021-07-16 NOTE — Telephone Encounter (Signed)
Pt went to pick up her test strips, the pharmacy advised her that her insurance will not cover them.    Kathryn [name] does not have any test strips at this time and questions what she should do.    [facility] request for alternate test strip has been attached/scanned

## 2021-07-16 NOTE — Telephone Encounter (Signed)
Prior authorization completed through CoverMyMeds.     (Key: [id number])    This meter is out of the patients formulary. I did submit a PA for the patients test strips however the insurance may require patient to use a formulary option.

## 2021-09-01 ENCOUNTER — Encounter

## 2021-09-01 NOTE — Telephone Encounter (Signed)
Last Appt: 01/14/2021  Next Appt: 11/25/2021  Lab Results   Component Value Date    LABA1C 5.6 03/27/2019     Lab Results   Component Value Date    CREATININE 1.05 (H) 03/27/2019      Ref Range & Units 08/30/19 0918   HEMOGLOBIN A1C 4.3 - 6.4 % 5.3    CALC MEAN BLD GLUC mg/dL 790      Pt overdue for labs.    Requested Prescriptions     Pending Prescriptions Disp Refills    Insulin Pen Needle 31G X 5 MM MISC 800 each 3     Sig: Use up to 8 per day to inject insulin

## 2021-09-01 NOTE — Telephone Encounter (Signed)
Pt needs an old Rx renewed.    BD Pen needles 5 m x 31 g   6- 8 times per day for both Glargine and Lispro pens.    Please add orders.    Kathryn [name]  1990/11/25      Call back needed: No    Preferred call back number:   Home Phone [phone] (home)    Medications Requested:  Requested Prescriptions      No prescriptions requested or ordered in this encounter       Preferred Pharmacy:   [id number] [place], [place] - [address] - P [phone] Carmon Ginsberg [phone]  [address] [place]  [place] [address]  Phone: [phone] Fax: [phone]      Other Instructions:

## 2021-09-02 MED ORDER — INSULIN PEN NEEDLE 31G X 5 MM MISC
3 refills | Status: AC
Start: 2021-09-02 — End: ?

## 2021-09-16 ENCOUNTER — Encounter

## 2021-09-16 NOTE — Telephone Encounter (Signed)
Medications Requested:  Requested Prescriptions     Pending Prescriptions Disp Refills    HUMALOG KWIKPEN 100 UNIT/ML SOPN [Pharmacy Med Name: HUMALOG 100 UNIT/ML KWIKPEN]  1     Sig: USE UP TO A TOTAL DOSE OF 30 UNITS EVERY DAY       Preferred Pharmacy:   [facility] [address] [place] - [address] [place] - P [phone] Carmon Ginsberg [phone]  [address] [place]  [place] [address]  Phone: [phone] Fax: [phone]      Date of Last Refill: 01/14/2021    Prescription Refill Protocol reviewed: Yes    Allergy List Reviewed and Verified: Yes    Possible medication to medication interactions reviewed: Yes    Last appt @ PCP Office: 01/14/2021     Future Appointments   Date Time Provider Department Center   11/25/2021  2:30 PM Bobbye Riggs, MD [facility]       MOST RECENT BLOOD PRESSURES  BP Readings from Last 3 Encounters:   01/14/21 108/70         MOST RECENT LAB DATA  Lab Results   Component Value Date/Time    K 3.8 03/27/2019 06:20 AM    ALT 23 03/26/2019 05:54 AM    HGB 11.3 03/26/2019 05:54 AM    HCT 33.3 03/26/2019 05:54 AM

## 2021-09-17 MED ORDER — HUMALOG KWIKPEN 100 UNIT/ML SC SOPN
100 UNIT/ML | SUBCUTANEOUS | 3 refills | Status: AC
Start: 2021-09-17 — End: 2021-11-05

## 2021-10-03 ENCOUNTER — Encounter

## 2021-10-04 NOTE — Telephone Encounter (Signed)
FASTING labs 1 week prior to next appt  Orders Placed This Encounter    CBC     Standing Status:   Future     Standing Expiration Date:   10/04/2022    Comprehensive Metabolic Panel     Standing Status:   Future     Standing Expiration Date:   10/04/2022    Lipid Panel     Standing Status:   Future     Standing Expiration Date:   10/04/2022    Microalbumin / Creatinine Urine Ratio     Standing Status:   Future     Standing Expiration Date:   10/04/2022    Hemoglobin A1C     Standing Status:   Future     Standing Expiration Date:   10/04/2022    TSH     Standing Status:   Future     Standing Expiration Date:   10/04/2022    Insulin Glargine-yfgn (SEMGLEE, YFGN,) 100 UNIT/ML SOPN     Sig: INJECT UP TO 22 UNITS EVERY DAY     Dispense:  45 mL     Refill:  1     15 prefilled pens

## 2021-10-04 NOTE — Telephone Encounter (Signed)
Medications Requested:  Requested Prescriptions     Pending Prescriptions Disp Refills    SEMGLEE, YFGN, 100 UNIT/ML SOPN [Pharmacy Med Name: SEMGLEE (YFGN) 100 UNIT/ML PEN]  1     Sig: INJECT UP TO 22 UNITS EVERY DAY       Preferred Pharmacy:   [facility] [address] [place] - [address] [place] - P [phone] Carmon Ginsberg [phone]  [address] [place]  [place] [address]  Phone: [phone] Fax: [phone]      Date of Last Refill: 01/14/2021    Prescription Refill Protocol reviewed: Yes    Allergy List Reviewed and Verified: Yes    Possible medication to medication interactions reviewed: Yes    Last appt @ PCP Office: 01/14/2021     Future Appointments   Date Time Provider Department Center   11/25/2021  2:30 PM Bobbye Riggs, MD [facility]       MOST RECENT BLOOD PRESSURES  BP Readings from Last 3 Encounters:   01/14/21 108/70         MOST RECENT LAB DATA  Lab Results   Component Value Date/Time    K 3.8 03/27/2019 06:20 AM    ALT 23 03/26/2019 05:54 AM    HGB 11.3 03/26/2019 05:54 AM    HCT 33.3 03/26/2019 05:54 AM

## 2021-10-05 MED ORDER — SEMGLEE (YFGN) 100 UNIT/ML SC SOPN
100 UNIT/ML | SUBCUTANEOUS | 1 refills | Status: AC
Start: 2021-10-05 — End: ?

## 2021-10-06 NOTE — Telephone Encounter (Signed)
Lab slip mailed to patient with reminder note to have labs done prior to upcoming appointment.

## 2021-11-04 ENCOUNTER — Telehealth

## 2021-11-04 NOTE — Telephone Encounter (Signed)
Pt called with 2 medication issues:    1) She needs a 1 month supply of HUMALOG KWIKPEN 100 UNIT/ML SOPN [facility] at [address], [place], [place] because she is traveling and she does not have enough for the weekend.  The one month is so she will have a reasonable co-pay.     2) Pt needs a Rx for [employer] sent to her local pharmacy [id number] [place] [place] - [address].    Please call when completed.

## 2021-11-05 MED ORDER — INSULIN LISPRO (1 UNIT DIAL) 100 UNIT/ML SC SOPN
100 UNIT/ML | SUBCUTANEOUS | 0 refills | Status: DC
Start: 2021-11-05 — End: 2021-11-05

## 2021-11-05 MED ORDER — DEXCOM G6 TRANSMITTER MISC
3 refills | Status: AC
Start: 2021-11-05 — End: 2022-06-13

## 2021-11-05 MED ORDER — INSULIN LISPRO (1 UNIT DIAL) 100 UNIT/ML SC SOPN
100 UNIT/ML | SUBCUTANEOUS | 0 refills | Status: AC
Start: 2021-11-05 — End: 2022-08-01

## 2021-11-05 NOTE — Telephone Encounter (Signed)
Patient called to follow up.  She is now out of the HUMALOG KWIKPEN 100 UNIT/ML SOPN. She needs it now [facility] to the [facility] at [address] [place], [place], [place] [address].    Pt is now out of her insulin.    Please call the Patient when completed.

## 2021-11-05 NOTE — Telephone Encounter (Signed)
Rx sent to requested pharmacy as requested

## 2021-11-05 NOTE — Telephone Encounter (Signed)
Left message for Patient that she should be all set.

## 2021-11-05 NOTE — Addendum Note (Signed)
Addended by: Jill Side on: 11/05/2021 10:50 AM     Modules accepted: Orders

## 2021-11-05 NOTE — Telephone Encounter (Signed)
1) Please send a 1 month Rx of the HUMALOG KWIKPEN 100 UNIT/ML SOPN [facility] to the [facility] at [address], [place], [place].  **5 PENS**    2) Pt needs a Rx for [employer] sent to her local pharmacy [id number] [place], [place] - [address]

## 2021-11-05 NOTE — Telephone Encounter (Signed)
Both prescriptions resent as request.

## 2021-11-18 NOTE — Progress Notes (Signed)
Lab slip mailed to patient with reminder note to have labs done prior to upcoming appointment.

## 2021-11-18 NOTE — Telephone Encounter (Signed)
Lab slip mailed to patient with reminder note to have labs done prior to upcoming appointment.  Mychart message sent with intended message

## 2021-11-24 NOTE — Telephone Encounter (Signed)
noted 

## 2021-11-24 NOTE — Telephone Encounter (Signed)
Pt called and she could not get into MyChart.  I did help her reset the password and she has access now.    She had to cancel for tomorrow as she flying to [place] for her grandmothers funeral.  She is now scheduled for 6/19 and 08/03/22.    She will update MF with blood sugars via mychart.

## 2021-11-24 NOTE — Telephone Encounter (Signed)
11/25/21 Appointment canceled per Melynda Keller messages.  LM X 2 to Confirm & Address DM 1 -- No Call back from pt to RS either.    Per [place] webex msg Cancel if no call back by Lunch hour today.      Melynda Keller messages

## 2021-11-25 ENCOUNTER — Ambulatory Visit: Payer: PRIVATE HEALTH INSURANCE | Attending: Internal Medicine | Primary: Internal Medicine

## 2022-02-02 ENCOUNTER — Inpatient Hospital Stay: Admit: 2022-02-02 | Payer: PRIVATE HEALTH INSURANCE | Primary: Internal Medicine

## 2022-02-02 DIAGNOSIS — E1043 Type 1 diabetes mellitus with diabetic autonomic (poly)neuropathy: Secondary | ICD-10-CM

## 2022-02-02 LAB — MICROALBUMIN URINE (EXT)
Creatinine, urine, random (INT/EXT): 121 mg/dL (ref 29–226)
MICROALBUMIN, URINE (EXT): 81.2 mg/L — ABNORMAL HIGH (ref 0–20.00)
Microalbumin/Creatinine Ratio Urine (EXT): 67 mg/g — ABNORMAL HIGH (ref <30)

## 2022-02-02 LAB — HEMOGLOBIN A1C
Estimated Average Glucose mg/dL (INT/EXT): 114 mg/dL
HEMOGLOBIN A1C % (INT/EXT): 5.6 % (ref 4.0–6.0)
Hemoglobin A1C: 5.6 % (ref 4.0–6.0)
eAG: 114 mg/dL

## 2022-02-02 LAB — COMPREHENSIVE METABOLIC PANEL
ALT: 52 U/L — ABNORMAL HIGH (ref 0–35)
AST: 49 U/L — ABNORMAL HIGH (ref 0–35)
Albumin/Globulin Ratio: 1.5 (ref 1.0–3.0)
Albumin: 4.1 g/dL (ref 3.5–5.2)
Alk Phosphatase: 100 U/L (ref 35–104)
Anion Gap: 8 mmol/L (ref 5–15)
BUN: 38 MG/DL — ABNORMAL HIGH (ref 6–20)
Bun/Cre Ratio: 25 — ABNORMAL HIGH (ref 12–20)
CO2: 23 mmol/L (ref 22–29)
Calcium: 8.9 MG/DL (ref 8.6–10.0)
Chloride: 109 mmol/L — ABNORMAL HIGH (ref 98–107)
Creatinine: 1.51 MG/DL — ABNORMAL HIGH (ref 0.51–0.95)
Est, Glom Filt Rate: 48 mL/min/{1.73_m2} — ABNORMAL LOW (ref >60)
Glucose: 89 mg/dL (ref 74–109)
Potassium: 4.7 mmol/L (ref 3.5–5.1)
Sodium: 140 mmol/L (ref 136–145)
Total Bilirubin: 0.28 mg/dL (ref 0–1.20)
Total Protein: 6.8 g/dL (ref 6.4–8.3)

## 2022-02-02 LAB — CBC
Hematocrit: 31.4 % — ABNORMAL LOW (ref 36.0–45.0)
Hemoglobin: 10.5 g/dL — ABNORMAL LOW (ref 11.8–14.8)
MCH: 28.4 PG (ref 25.6–32.2)
MCHC: 33.4 g/dL (ref 32.2–35.5)
MCV: 84.9 FL (ref 80.0–95.0)
MPV: 10.2 FL (ref 9.4–12.4)
Nucleated RBCs: 0 PER 100 WBC (ref 0–0.02)
Platelets: 226 10*3/uL (ref 150–400)
RBC: 3.7 M/uL — ABNORMAL LOW (ref 3.93–5.22)
RDW: 12.7 % (ref 11.6–14.4)
WBC: 5.8 10*3/uL (ref 4.0–10.0)
nRBC: 0 10*3/uL

## 2022-02-02 LAB — LIPID PANEL
Chol/HDL Ratio: 3
Cholesterol: 161 MG/DL (ref <200)
HDL: 54 MG/DL (ref >50)
LDL Cholesterol: 94.8 MG/DL (ref <100)
Non-HDL Cholesterol: 107 mg/dL (ref <130)
Triglycerides: 61 MG/DL (ref 0–150)

## 2022-02-02 LAB — MICROALBUMIN / CREATININE URINE RATIO
Creatinine, Ur: 121 mg/dL (ref 29–226)
Microalbumin Creatinine Ratio: 67 mg/g — ABNORMAL HIGH (ref <30)
Microalbumin, Random Urine: 81.2 mg/L — ABNORMAL HIGH (ref 0–20.00)

## 2022-02-02 LAB — TSH: TSH: 0.008 u[IU]/mL — ABNORMAL LOW (ref 0.270–4.200)

## 2022-02-07 ENCOUNTER — Ambulatory Visit
Admit: 2022-02-07 | Discharge: 2022-02-07 | Payer: PRIVATE HEALTH INSURANCE | Attending: Internal Medicine | Primary: Internal Medicine

## 2022-02-07 DIAGNOSIS — E1043 Type 1 diabetes mellitus with diabetic autonomic (poly)neuropathy: Secondary | ICD-10-CM

## 2022-02-07 MED ORDER — OMNIPOD 5 G6 POD (GEN 5) MISC
3 refills | Status: AC
Start: 2022-02-07 — End: ?

## 2022-02-07 MED ORDER — OMNIPOD 5 G6 INTRO (GEN 5) KIT
PACK | 0 refills | Status: AC
Start: 2022-02-07 — End: ?

## 2022-02-07 NOTE — Progress Notes (Unsigned)
31 y.o. female here to establish  care for of type 1  diabetes.    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 No longer withhold insulin to control weight      CGMS se dexcom clarity bellow    Interpretation of EXB:MWUXL sugars reviewed via {mcfcgms:52210} from ***/***.2023 to ***/***/2023 . Average blood sugars is *** . Time in target range is ***.%(goal > 70%). Hypoglycemia is {frequency:52307} with *** % of blood sugars < 70(level 1 hypoglycemia) and *** % < 54(level 2 hypoglycemia).  Glucos variability/ coefficient of variation is *** (goal < 36%) Patterns:{mcfpostprandial:52309}            Diabetes related complications   Retinopathy: +    Nephropathy:? autominc neuropathy-+ gustatory hyperhidrosis   Neuropathy:+ numbness   Macrovascular:no    Key Antihyperglycemic Medications            insulin lispro, 1 Unit Dial, (HUMALOG KWIKPEN) 100 UNIT/ML SOPN    Sig: USE UP TO A TOTAL DOSE OF 30 UNITS EVERY DAY    Notes to Pharmacy: Patient requires emergency refill for travel    Cosign for Ordering: Accepted by Bobbye Riggs, MD on 11/05/2021  1:57 PM    Insulin Glargine-yfgn (SEMGLEE, YFGN,) 100 UNIT/ML SOPN    Sig: INJECT UP TO 22 UNITS EVERY DAY    Notes to Pharmacy: 15 prefilled pens           Key Hyperlipidemia Meds       The patient is on no antihyperlipidemia meds.           Key Anti-Hypertensive Meds       Patient is on no Anti-hypertensive Meds.                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)    Gustatory hyperhidrosis    Acne excoriee    AKI (acute kidney injury) (HCC)    Anxiety    BMI between 19-24,adult    Diffuse goiter    Dysautonomia-like disorder    Elevated blood pressure reading without diagnosis of hypertension    Fibromyalgia affecting multiple sites    Hashimoto's thyroiditis    Pre-syncope    Orthostatic hypotension    Nexplanon in place    Renal insufficiency syndrome    Stage 3a chronic kidney disease (CKD) (HCC)    Syncope      Family History   Problem Relation Age of Onset    Atrial Fibrillation  Mother     Other Father       Current Outpatient Medications   Medication Sig Dispense Refill    escitalopram (LEXAPRO) 10 MG tablet Take 1 tablet by mouth daily      LORazepam (ATIVAN) 1 MG tablet Take by mouth every 8 hours as needed.      insulin lispro, 1 Unit Dial, (HUMALOG KWIKPEN) 100 UNIT/ML SOPN USE UP TO A TOTAL DOSE OF 30 UNITS EVERY DAY 15 mL 0    Continuous Blood Gluc Transmit (DEXCOM G6 TRANSMITTER) MISC DEVICE FOR CONTINUOUS GLUCOSE MONITORING CHANGE EVERY 3 MONTHS 1 each 3    Insulin Glargine-yfgn (SEMGLEE, YFGN,) 100 UNIT/ML SOPN INJECT UP TO 22 UNITS EVERY DAY 45 mL 1    Insulin Pen Needle 31G X 5 MM MISC Use up to 8 per day to inject insulin 800 each 3    blood glucose test strips (ACCU-CHEK GUIDE) strip Use to test blood glucose levels up to five times daily. 200 each 3    blood glucose test strips (ACCU-CHEK GUIDE) strip Use 5-6 times daily to check blood sugars 600 each 3     No current facility-administered medications for this visit.           lantus 22 units every day   ISF  1: 50   I:CHO 1:12   I reviewed the following labs with [name]    Hemoglobin A1C   Date Value Ref Range Status    02/02/2022 5.6 4.0 - 6.0 % Final     Comment:     Reference Range  Diabetic >=6.5%  Prediabetes  5.7-6.4%  Normal       <5.7%  The American Diabetes Association recommends an A1C goal of <7% for most adults with diabetes.       Lab Results   Component Value Date    CHOL 161 02/02/2022     Lab Results   Component Value Date    TRIG 61 02/02/2022     Lab Results   Component Value Date    HDL 54 02/02/2022     Lab Results   Component Value Date    LDLCHOLESTEROL 94.8 02/02/2022     No results found for: LABVLDL, VLDL  Lab Results   Component Value Date    CHOLHDLRATIO 3.0 02/02/2022     Lab Results   Component Value Date    NA 140 02/02/2022    K 4.7 02/02/2022    CL 109 (H) 02/02/2022    CO2 23 02/02/2022    BUN 38 (H) 02/02/2022    CREATININE 1.51 (H) 02/02/2022    GLUCOSE 89 02/02/2022    CALCIUM 8.9 02/02/2022    PROT 6.8 02/02/2022    LABALBU 4.1 02/02/2022    BILITOT 0.28 02/02/2022    ALKPHOS 100 02/02/2022    AST 49 (H) 02/02/2022    ALT 52 (H) 02/02/2022    LABGLOM 48 (L) 02/02/2022    GFRAA >60 03/27/2019    AGRATIO 0.8 (L) 03/26/2019       Lab Results   Component Value Date    LABMICR 81.20 (H) 02/02/2022         No results found for: CHOL, CHOLPOCT, CHOLX, CHLST, CHOLV, HDL, HDLP, LDL, LDLC, DLDLP, TGLX, TRIGL, TRIGP, TGLPOCT, NTGLT, CHHD, CHHDX   No results found for: MCACR, MCA1, MCA2, MCA3, MCAU, MCAU2, MCALPOCT   There were no vitals taken for this visit.     Diabetic foot exam:  Left Foot:   Visual Exam: {Foot Inspection :37963::"normal"}   Pulse DP: {Foot Pulse DP:37964::"2+ (normal)"}   Filament test: {EXAM NEUROFILAMENT TEST:37965::"normal sensation"}   {Vibratory Sensation (Optional):37966::"normal"}  Right Foot:   Visual Exam: {Foot Inspection :37963::"normal"}   Pulse DP: {Foot Pulse DP:37964::"2+ (normal)"}   Filament test: {EXAM NEUROFILAMENT TEST:37965::"normal sensation"}   {Vibratory Sensation (Optional):37966::"normal"}                        1. 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 Chronic kidney disease, stage 3a (HCC)  3. Hashimoto's thyroiditis  4. Nephropathy screen

## 2022-02-08 NOTE — Assessment & Plan Note (Signed)
TSH undetectable.  PLAN:  - repeat TFT's  - TSH receptor ab

## 2022-02-08 NOTE — Assessment & Plan Note (Signed)
GFR down to 49. GFR decrease disproportionally to proteinuria. Discussed  Adding ARB. Revived potential role of SGLP2 therapy with ketone monitoring.  PLAN:  - renal ultrasound  - nephrology consultation  - discussed ARB- pt prefers to discuss with nephrology before initiating any therapy

## 2022-02-08 NOTE — Assessment & Plan Note (Addendum)
Overly tight glycemic control Average blood sugars is 107 . Time in target range is 86.%(goal > 70%). Hypoglycemia is frequent with 10 % of blood sugars < 70(level 1 hypoglycemia) and 0 % < 54(level 2 hypoglycemia).  Glucos variability/ coefficient of variation is elevated at 36.5% (goal < 36%) Patterns: Blood sugars trend downward while fasting suggesting an over aggressive basal insulin dose.   PLAN:  - decrease lantus from 22 to 18- 20 units  - discussed that the goal of basal insulin is to maintain a stable blood sugar while fasting  - revived increasing bolus by 25% to cover high fat meals/ discussed split meal bolus  - reviewed the potential benefits of a hybrid closed loop insulin pump  - will see if Esmay qualifies for 30 day free trial of omnipod 5. She will call me to set up training when she receives OP5  - reviewed importance of a planned pregnancy.

## 2022-02-08 NOTE — Assessment & Plan Note (Signed)
Last micro albumin  creat ratio was  elevated on 02/02/2022  PLAN:  - discussed ARB/ACE therapy  -discussed SGLP2 therapy with ketone montoring  - nephrology consultation

## 2022-02-24 ENCOUNTER — Inpatient Hospital Stay: Admit: 2022-02-24 | Payer: PRIVATE HEALTH INSURANCE | Attending: Internal Medicine | Primary: Internal Medicine

## 2022-02-24 DIAGNOSIS — E1043 Type 1 diabetes mellitus with diabetic autonomic (poly)neuropathy: Secondary | ICD-10-CM

## 2022-02-24 NOTE — Telephone Encounter (Signed)
Patient returned Dr. Ronni Rumble call.  Pt verbally states she understands message.  She has not made a nephrology appointment yet as [place] was [place] out [place]. Pt states she will make the appointment tomorrow.

## 2022-02-24 NOTE — Telephone Encounter (Signed)
Kathryn [name] called. Message to call left  Intended message  See my chart message bellow:  Hi [name]    I am sorry that I was unable to reach you by phone. Your recent ultrasound showed no evidence of any kidney stones or mass. There were some changes that can be seen win chronic kidney disease. As we discussed at you appointment, I would like for you to evaluated by a nephrologist. Have you been able to set up an appointment with nephrology?    I hope all is well,    [name]  Ultrasound Result (most recent):  [name] RETROPERITONEAL COMPLETE 02/24/2022    Narrative  EXAM:  [name] RETROPERITONEUM COMP    HISTORY:  decreased GFR    COMPARISON:  None.    TECHNIQUE:  Transabdominal sonogram of the kidneys and bladder.    FINDINGS:  Poor corticomedullary differentiation is seen bilaterally.  No hydronephrosis is seen.  The right kidney measures 12.2 cm.  Left kidney measures 11.1 cm.  There is no calculus in no focal mass lesion.    The urinary bladder shows no focal mass.  Both ureteral jets were seen.    Impression  1. There is no renal calculus or hydronephrosis.  2. Poor corticomedullary differentiation bilaterally.    Workstation Name: [id number]  THIS IS AN ELECTRONICALLY VERIFIED FINAL REPORT  02/24/2022 3:54 PM - Electronically signed by Linna Caprice

## 2022-02-25 ENCOUNTER — Inpatient Hospital Stay: Admit: 2022-02-25 | Payer: PRIVATE HEALTH INSURANCE | Primary: Internal Medicine

## 2022-02-25 DIAGNOSIS — E1043 Type 1 diabetes mellitus with diabetic autonomic (poly)neuropathy: Secondary | ICD-10-CM

## 2022-02-25 LAB — T3, FREE: T3, Free: 3.02 pg/mL (ref 2.00–4.40)

## 2022-02-25 LAB — T4, FREE: T4 Free: 1.3 ng/dL (ref 0.9–1.7)

## 2022-02-25 LAB — TSH: TSH: 0.007 u[IU]/mL — ABNORMAL LOW (ref 0.270–4.200)

## 2022-02-25 NOTE — Telephone Encounter (Signed)
noted 

## 2022-02-28 LAB — THYROTROPIN RECEPTOR ANTIBODY: Thyrotropin Receptor Ab: <1.1 IU/L (ref 0.00–1.75)

## 2022-03-01 ENCOUNTER — Telehealth

## 2022-03-01 NOTE — Telephone Encounter (Signed)
Kathryn [name] called.   Message left that I will send a my chart note. See my chart note bellow:  Hi Kathryn [name]    I am sorry that I was not able to reach you by phone. Your recent labs revealed:  - TSH remains low. This is consistent with an overactive thyroid  - thyroid hormone levels(t4 and T3) are in normal range.  - TSH receptor antibody(test for Graves disease) was negative.  I would advise that you repeat labs tests for your thyroid in 2-3 weeks. If there is continued evidence of on over active thyroid I would plan on imaging your thyroid with an ultrasound and 123 thyroid scan. I will send orders for the repeat labs to the [facility] lab. Please let me know if you wish to do your labs elsewhere.     I hope all is well.    Kathryn [name]    Orders Placed This Encounter    T4, Free     Standing Status:   Future     Standing Expiration Date:   03/02/2023    TSH     Standing Status:   Future     Standing Expiration Date:   03/02/2023    T3, Free     Standing Status:   Future     Standing Expiration Date:   03/02/2023      Hospital Outpatient Visit on 02/25/2022   Component Date Value Ref Range Status    T4 Free 02/25/2022 1.3  0.9 - 1.7 ng/dL Final    T3, Free 21/30/8657 3.02  2.00 - 4.40 pg/mL Final    Thyrotropin Receptor Ab 02/25/2022 <1.10  0.00 - 1.75 IU/L Final    Comment: (NOTE)    -------------------ADDITIONAL INFORMATION-------------------  At a decision limit of 1.75 IU/L, this assay   has 97% sensitivity and 99% specificity for   detection of Graves' disease. In healthy   individuals and in patients with thyroid disease   without diagnosis of Graves' disease, the upper   limit of anti-TSHR values are 1.22 IU/L and   1.58 IU/L, respectively (97.5th percentiles).    Test Performed by:  [facility]  [address] [place], [place], [place] [address]  Lab Director: Paul Dykes M.D. Ph.D.; CLIA# [id number]      TSH 02/25/2022 0.007 (L)  0.270 - 4.200 uIU/mL Final

## 2022-03-08 NOTE — Telephone Encounter (Signed)
Form filled out and placed in providers office for review and signature.

## 2022-03-08 NOTE — Telephone Encounter (Signed)
Omnipod pump therapy order form to complete.    Printed and in the foler  See attached / scanned

## 2022-03-22 NOTE — Telephone Encounter (Signed)
[name] called.  Message left that it would be helpful to have her weight( has declined to be weighted so will be respectfull of her wishes  Based on a total daily dose of 3u units    Basal rate 0.65 units/ 24 hours  I:CHO 1: 12  ISF 1: 50  Target   -12- 6 a 130  - 6a- 12 120    Hi Kathryn [name]    I have completed your pump start orders for the omnipod.  To start I have set fairly conservative parameters.  During the night I asked the pump to target her blood sugar to 130.  During the day I asked the pump to target her blood sugar to 120.  Once we know your not having significant low blood sugars we can reduce this target to 110.  I sent insulin to carb ratio of 1:12 a.m. and a correcting factor of 1:50 a.m..  This should be fairly close to your currently using.  I did back down on her basal insulin to 0.65 units/hour.  This will give you a total basal insulin around 15 units per day.  Pump will give you additional insulin as needed and recalculate this every 3 days.  It is typically safer to be less aggressive and increase insulin as needed.  Please call me within 10 days of starting Omnipod so that we can review your settings.    I hope all is well     [name]

## 2022-03-24 NOTE — Telephone Encounter (Signed)
Orders faxed as requested.

## 2022-04-20 NOTE — Telephone Encounter (Signed)
Pt left a message with the answering service asking for a call     Message: started omni pod - update- please call

## 2022-04-21 NOTE — Telephone Encounter (Signed)
Lm for pt to return our call.

## 2022-04-22 ENCOUNTER — Telehealth

## 2022-04-22 ENCOUNTER — Inpatient Hospital Stay: Admit: 2022-04-22 | Payer: PRIVATE HEALTH INSURANCE | Primary: Internal Medicine

## 2022-04-22 DIAGNOSIS — R7989 Other specified abnormal findings of blood chemistry: Secondary | ICD-10-CM

## 2022-04-22 LAB — T4, FREE: T4 Free: 1.4 ng/dL (ref 0.9–1.7)

## 2022-04-22 LAB — T3, FREE: T3, Free: 3.01 pg/mL (ref 2.00–4.40)

## 2022-04-22 LAB — TSH: TSH: 0.008 u[IU]/mL — ABNORMAL LOW (ref 0.270–4.200)

## 2022-04-22 NOTE — Telephone Encounter (Signed)
Kathryn [name] called  - has started OP5. Happy with transition.Notes nocturnal hyperglycemia. No hypoglycemia. Not linked into glooko. Instructions added to link  accounts. Advised decreasing nocturnal target / correct above to 120 and day time target to 110  - has a nephrology appt  -  labs consistent with subclinical hyperthyroidism. Notes increased anxiety. TSH receptor ab negative. Will proceed with I123 scan and thyroid ultrasound. Discussed that she cannot be pregnant at the time of her I123 thyroid scan.   -    Orders Placed This Encounter    NM THYROID UPTAKE AND SCAN     Standing Status:   Future     Standing Expiration Date:   04/23/2023     Order Specific Question:   Reason for exam:     Answer:   subclinical hyperthyroidism    [name] HEAD NECK SOFT TISSUE THYROID           Standing Status:   Future     Standing Expiration Date:   04/23/2023     Order Specific Question:   Reason for exam:     Answer:   subclinical hyperthyroidism    Pregnancy, Urine     Standing Status:   Future     Standing Expiration Date:   04/23/2023

## 2022-04-22 NOTE — Telephone Encounter (Signed)
From: Dr. Bobbye Riggs  To: Afreen Siebels  Sent: 04/22/2022 4:23 PM EDT  Subject: linking glooko    Hi Anamarie Hunn    Here is the information on linking glooko accounts  LINKING YOUR GLOOKO ACCOUNT

## 2022-05-04 ENCOUNTER — Encounter

## 2022-05-04 NOTE — Telephone Encounter (Signed)
Blood sugars reviewed   See my chart note  Hi Kathryn [name]    I took a quick look at your blood sugars on glooko. Overall you seem to be doing well  Here are a few thoughts:   - blood sugars are very stable when you are fasting. It looks like the smart adjust technology is working well  - you went low the night of 05/03/2022. Was this associated with exercise or an over aggressive bolus?  - of you have no other episodes of log blood sugars at night could decrease your night time target to 110  - still some intermittent after meal high blood sugars. Try to bolus 15 minutes before you meal.     Keep up the good work!!!    [name]

## 2022-05-05 ENCOUNTER — Encounter

## 2022-05-05 NOTE — Telephone Encounter (Signed)
From: Dr. Bobbye Riggs  To: Kathryn [name]  Sent: 05/04/2022 5:56 PM EDT  Subject: blood sugars    Hi Kathryn [name]    I took a quick look at your blood sugars on glooko. Overall you seem to be doing well  Here are a few thoughts:   - blood sugars are very stable when you are fasting. It looks like the smart adjust technology is working well  - you went low the night of 05/03/2022. Was this associated with exercise or an over aggressive bolus?  - of you have no other episodes of log blood sugars at night could decrease your night time target to 110  - still some intermittent after meal high blood sugars. Try to bolus 15 minutes before you meal.     Keep up the good work!!!    [name]

## 2022-05-06 MED ORDER — INSULIN LISPRO 100 UNIT/ML IJ SOLN
100 UNIT/ML | INTRAMUSCULAR | 3 refills | Status: AC
Start: 2022-05-06 — End: 2022-06-12

## 2022-05-06 MED ORDER — OMNIPOD 5 G6 PODS (GEN 5) MISC
3 refills | Status: DC
Start: 2022-05-06 — End: 2022-10-19

## 2022-05-06 MED ORDER — INSULIN LISPRO 100 UNIT/ML IJ SOLN
100 UNIT/ML | INTRAMUSCULAR | 3 refills | Status: DC
Start: 2022-05-06 — End: 2022-05-06

## 2022-05-06 NOTE — Telephone Encounter (Signed)
Duplicate

## 2022-05-06 NOTE — Telephone Encounter (Signed)
[facility] notification re Insulin Lispro stating: "ins prefers Programmer, systems. Please resend as one or the other"    See attached / scanned

## 2022-05-06 NOTE — Telephone Encounter (Signed)
Please advise, Insurance wants med change.

## 2022-05-06 NOTE — Telephone Encounter (Signed)
Orders Placed This Encounter    insulin lispro (HUMALOG) 100 UNIT/ML SOLN injection vial     Sig: Inject up to a total daily dose of 60 units qd as directed via insulin pump     Dispense:  60 mL     Refill:  3

## 2022-05-21 ENCOUNTER — Inpatient Hospital Stay: Admit: 2022-05-21 | Payer: PRIVATE HEALTH INSURANCE | Attending: Internal Medicine | Primary: Internal Medicine

## 2022-05-21 DIAGNOSIS — R7989 Other specified abnormal findings of blood chemistry: Secondary | ICD-10-CM

## 2022-05-24 ENCOUNTER — Telehealth

## 2022-05-24 MED ORDER — METHIMAZOLE 5 MG PO TABS
5 MG | ORAL_TABLET | ORAL | 1 refills | Status: AC
Start: 2022-05-24 — End: 2022-06-21

## 2022-05-24 NOTE — Telephone Encounter (Signed)
Kathryn [name] called  - blood sugars reviewed and are in target range  - was seen by nephrology  - thyroid ultrasound consistent with Graves disease. Given persistent decrease in TSH advised starting MMI 2.5 mg qd. Reviewed risks of agranulocytosis and hepatic toxicity  Kathryn [name] next appointment is on 08/03/2022   Orders Placed This Encounter    TSH     Standing Status:   Future     Standing Expiration Date:   05/25/2023    T3, Free     Standing Status:   Future     Standing Expiration Date:   05/25/2023    T4, Free     Standing Status:   Future     Standing Expiration Date:   05/25/2023    Hemoglobin A1C     Standing Status:   Future     Standing Expiration Date:   05/25/2023    Comprehensive Metabolic Panel     Standing Status:   Future     Standing Expiration Date:   05/25/2023    methIMAzole (TAPAZOLE) 5 MG tablet     Sig: 1/2 tab qd     Dispense:  15 tablet     Refill:  1                Lab Results   Component Value Date    TSH 0.008 (L) 04/22/2022    FT3 3.01 04/22/2022    T4FREE 1.4 04/22/2022

## 2022-06-07 NOTE — Telephone Encounter (Signed)
Unread my chart message. Please review with Oneita Kras  From   Rockne Menghini, MD To   [place], [place] Sent and Delivered   05/24/2022  8:35 AM       Hi Joycelyn Schmid     Here is just a reminder of the side effects of methimazole.  Methimazole therapy can rarely be associated with a low white cell count.  You should contact the office immediately should you develop a severe sore throat or fever so that a white cell count can be ordered.  Methimazole can also be associated with liver dysfunction.  You should call should she developed pain in the right upper side of your abdomen, notice a yellowing of your skin, developed a marked decrease in appetite or  a marked lightening of your  stool color.     I have ordered the following labs to be done in the next 6-8 weeks.           Orders Placed This Encounter    TSH       Standing Status:   Future       Standing Expiration Date:   05/25/2023    T3, Free       Standing Status:   Future       Standing Expiration Date:   05/25/2023    T4, Free       Standing Status:   Future       Standing Expiration Date:   05/25/2023    Hemoglobin A1C       Standing Status:   Future       Standing Expiration Date:   05/25/2023    Comprehensive Metabolic Panel       Standing Status:   Future       Standing Expiration Date:   05/25/2023    methIMAzole (TAPAZOLE) 5 MG tablet       Sig: 1/2 tab qd       Dispense:  15 tablet       Refill:  1

## 2022-06-08 NOTE — Telephone Encounter (Signed)
Spoke to patient, she was unable to talk at this time.  I informed her of the labs to be done and asked her to read the intended message, which she has agreed to do.  If unable to read message pt will call us back.

## 2022-06-12 ENCOUNTER — Encounter

## 2022-06-13 ENCOUNTER — Telehealth

## 2022-06-13 MED ORDER — DEXCOM G6 TRANSMITTER MISC
3 refills | Status: AC
Start: 2022-06-13 — End: ?

## 2022-06-13 MED ORDER — DEXCOM G6 SENSOR MISC
3 refills | Status: AC
Start: 2022-06-13 — End: 2023-03-20

## 2022-06-13 MED ORDER — INSULIN LISPRO 100 UNIT/ML IJ SOLN
100 UNIT/ML | INTRAMUSCULAR | 2 refills | Status: DC
Start: 2022-06-13 — End: 2023-06-10

## 2022-06-13 NOTE — Telephone Encounter (Signed)
Medications Requested:  Requested Prescriptions     Pending Prescriptions Disp Refills    insulin lispro (HUMALOG) 100 UNIT/ML SOLN injection vial 60 mL 3     Sig: Inject up to a total daily dose of 60 units qd as directed via insulin pump       Preferred Pharmacy:   [id number] [place] [place] [phone] Wanda Plump [phone]  [address]  [place] [address]  Phone: [phone] Fax: [phone]    [id number] [place], [place] [address] [facility] [address] [phone] - F [phone]  [place] [address]  Phone: [phone] Fax: [phone]    Prescription Refill Protocol reviewed: Yes    Allergy List Reviewed and Verified: Yes    Possible medication to medication interactions reviewed: Yes    Last appt @ Office: 02/07/2022     Future Appointments   Date Time Provider [place]   06/28/2022  8:00 AM [facility] NM UPTAKE SJNRNM [facility]   06/28/2022 11:00 AM [facility]N NM UPTAKE SJNRNM [facility]N   06/29/2022  8:00 AM [facility]N NM SYMBIA 1 SJNRNM [facility]N   08/03/2022  2:30 PM Rockne Menghini, MD NEN [facility] AMB       MOST RECENT BLOOD PRESSURES  BP Readings from Last 3 Encounters:   02/07/22 130/76   01/14/21 108/70         MOST RECENT LAB DATA  Lab Results   Component Value Date/Time    K 4.7 02/02/2022 02:52 PM    ALT 52 02/02/2022 02:52 PM    TSH 0.008 04/22/2022 10:07 AM    CHOL 161 02/02/2022 02:52 PM    HGB 10.5 02/02/2022 02:52 PM    HCT 31.4 02/02/2022 02:52 PM

## 2022-06-13 NOTE — Telephone Encounter (Signed)
Call placed to pt to see if they need the qwickpen. Previous note stated she needed this for emergency travel.  Waiting call back.

## 2022-06-13 NOTE — Telephone Encounter (Signed)
Medications Requested:  Requested Prescriptions     Pending Prescriptions Disp Refills    Continuous Blood Gluc Transmit (DEXCOM G6 TRANSMITTER) MISC 1 each 3     Sig: DEVICE FOR CONTINUOUS GLUCOSE MONITORING CHANGE EVERY 3 MONTHS    Continuous Blood Gluc Sensor (DEXCOM G6 SENSOR) MISC 10 each 3     Sig: Change every 10 days       Preferred Pharmacy:   [id number] [place] [place] [phone] Wanda Plump [phone]  [place] [address]  Phone: [phone] Fax: [phone]    [id number] [place], [place] - [address] [facility] [address] [phone] Wanda Plump [phone]  [address] [facility] [address]  [place] [address]  Phone: [phone] Fax: [phone]      Date of Last Refill: 11/05/2021 (Transmitter)           03/20/2021 (Sensor)    Prescription Refill Protocol reviewed: Yes    Allergy List Reviewed and Verified: Yes    Possible medication to medication interactions reviewed: Yes    Last appt @ PCP Office: 02/07/2022     Future Appointments   Date Time Provider [place]   06/28/2022  8:00 AM [facility] NM UPTAKE S[facility]NM [facility]   06/28/2022 11:00 AM [facility]N NM UPTAKE SJNRNM [facility]N   06/29/2022  8:00 AM [facility]N NM SYMBIA 1 SJNRNM [facility]N   08/03/2022  2:30 PM Rockne Menghini, MD NEN [facility] AMB       MOST RECENT BLOOD PRESSURES  BP Readings from Last 3 Encounters:   02/07/22 130/76   01/14/21 108/70         MOST RECENT LAB DATA  Lab Results   Component Value Date/Time    K 4.7 02/02/2022 02:52 PM    ALT 52 02/02/2022 02:52 PM    TSH 0.008 04/22/2022 10:07 AM    CHOL 161 02/02/2022 02:52 PM    HGB 10.5 02/02/2022 02:52 PM    HCT 31.4 02/02/2022 02:52 PM

## 2022-06-13 NOTE — Telephone Encounter (Signed)
-----   Message from Oneita Kras sent at 06/12/2022 10:00 AM EDT -----  Regarding: Dexcom Sensor Refill  Contact: [phone]  Hi,    I'm not sure why the sensor prescription isn't coming up for a refill, but I will need the sensor along with the transmitter. Thank you!!    Kathryn [name]

## 2022-06-21 ENCOUNTER — Encounter

## 2022-06-21 MED ORDER — METHIMAZOLE 5 MG PO TABS
5 MG | ORAL_TABLET | ORAL | 1 refills | Status: DC
Start: 2022-06-21 — End: 2022-12-13

## 2022-06-21 NOTE — Telephone Encounter (Signed)
Medications Requested:  Requested Prescriptions     Pending Prescriptions Disp Refills    methIMAzole (TAPAZOLE) 5 MG tablet 15 tablet 1     Sig: 1/2 tab qd       Preferred Pharmacy:   [id number] [place] [place] [facility] [phone]  [place] [address]  Phone: [phone] Fax: [phone]        Prescription Refill Protocol reviewed: Yes    Allergy List Reviewed and Verified: Yes    Possible medication to medication interactions reviewed: Yes    Last appt @ Office: 02/07/2022     Future Appointments   Date Time Provider [place]   06/28/2022  8:00 AM [facility] NM UPTAKE [facility]   06/28/2022 11:00 AM [facility] NM UPTAKE [facility]   06/29/2022  8:00 AM [facility] NM SYMBIA 1 [facility]   08/03/2022  2:30 PM Rockne Menghini, MD NEN [facility] AMB       MOST RECENT BLOOD PRESSURES  BP Readings from Last 3 Encounters:   02/07/22 130/76   01/14/21 108/70         MOST RECENT LAB DATA  04/22/2022  TSH: 0.008  Free, T4: 1.4  Free, T3: 3.01

## 2022-06-21 NOTE — Telephone Encounter (Signed)
Patient's name and date of birth verified at start of call.   Incoming refill request.  Caller has been notified that we require a minimum of 2 business days for refill processing. (This does not include weekends, holidays, etc.)      Kathryn [name]  [birth year]      Confirmed best contact number:   Home Phone [phone] (home)    Medications Requested:  Requested Prescriptions     Pending Prescriptions Disp Refills    methIMAzole (TAPAZOLE) 5 MG tablet 15 tablet 1     Sig: 1/2 tab qd       Preferred Pharmacy:   [id number] [place], [place] [phone] [phone]  [place] [address]  Phone: [phone] Fax: [phone]    Has patient already contacted pharmacy to confirm no refills were on file: No    Notes for office regarding medication request:          Other instructions and notes:  Last visit with provider: 02/07/2022  Next scheduled visit with provider: 08/03/2022  *If next visit is not scheduled, book next needed visit or document if recall was added to list prior to sending for processing*  Inform patient that this needs to be on file before sending request as we do require for them to remain up-to-date with their recommended healthcare in order to avoid any interruptions in our ability to provide ongoing care such as refills.     Patient MyChart Status:  For Active Patients - Patient has been notified that they will receive an automated notification via MyChart once their script has been processed.   For Inactive Patients - Patient is aware that we have a patient portal, MyChart, which offers many benefits such as being able to request their refills electronically and receive automated notifications when scripts are processed.  In addition, they can schedule and manage appointments, view their testing results and visit notes, and stay connected with their care team.  Patient offered MyChart today.  Patient declined.  (Send link for set up if accepted)

## 2022-06-25 ENCOUNTER — Encounter

## 2022-06-27 NOTE — Telephone Encounter (Signed)
Duplicate request

## 2022-06-28 ENCOUNTER — Inpatient Hospital Stay: Payer: PRIVATE HEALTH INSURANCE | Attending: Internal Medicine | Primary: Internal Medicine

## 2022-06-28 DIAGNOSIS — R7989 Other specified abnormal findings of blood chemistry: Secondary | ICD-10-CM

## 2022-06-29 ENCOUNTER — Ambulatory Visit: Payer: PRIVATE HEALTH INSURANCE | Primary: Internal Medicine

## 2022-06-29 NOTE — Telephone Encounter (Signed)
From: Oneita Kras  To: Dr. Rockne Menghini  Sent: 06/29/2022 11:59 AM EST  Subject: Humalog     Hi Dr. Bennett Scrape,     I have been trying to submit a refill request for my humalog because I am running out and my request keeps getting denied. I am very worried I won't have any. Please advise on what I should do.     Thanks,  Kathryn [name]

## 2022-07-06 NOTE — Telephone Encounter (Signed)
Prior authorization submitted through CoverMyMeds.[email or web address]. Awaiting response from insurance.     (Key: [id number])

## 2022-07-12 MED ORDER — NOVOLOG 100 UNIT/ML IJ SOLN
100 | INTRAMUSCULAR | 3 refills | 84.00000 days | Status: DC
Start: 2022-07-12 — End: 2024-03-04

## 2022-07-12 NOTE — Telephone Encounter (Signed)
Prior authorization has still not been approved.  I called cover my meds and got run around.  [facility] they state novolog fiasp is covered sand they have it in stock.  Any chance we can change it?

## 2022-07-12 NOTE — Addendum Note (Signed)
Addended by: Rockne Menghini on: 07/12/2022 05:16 PM     Modules accepted: Orders

## 2022-07-19 NOTE — Telephone Encounter (Signed)
[facility]

## 2022-08-03 ENCOUNTER — Ambulatory Visit: Payer: PRIVATE HEALTH INSURANCE | Attending: Internal Medicine | Primary: Internal Medicine

## 2022-08-03 DIAGNOSIS — E1043 Type 1 diabetes mellitus with diabetic autonomic (poly)neuropathy: Secondary | ICD-10-CM

## 2022-08-03 NOTE — Progress Notes (Deleted)
31 y.o.y.o. Kathryn [name] here to establish  care for of type 1  diabetes.    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 No longer withhold insulin to control weight   Interim History :  Stressed woth work   CGMS se dexcom clarity bellow         Interpretation of FIE:PPIRJ sugars reviewed via {mcfcgms:52210} from ***/***.2023 to ***/***/2023 . Average blood sugars is *** . Time in target range is ***.%(goal > 70%). Hypoglycemia is {frequency:52307} with *** % of blood sugars < 70(level 1 hypoglycemia) and *** % < 54(level 2 hypoglycemia).  Glucose variability/ coefficient of variation is *** (goal < 36%) . Total basal insulin via automated mode= *** units/ 24 hours. Total basal insulin manual mode = *** units/ 24 hours. Patterns:{mcfpostprandial:52309}               Diabetes related complications   Retinopathy: +    Nephropathy:? autominc neuropathy-+ gustatory hyperhidrosis   Neuropathy:+ numbness   Macrovascular:no   Thyroid screen:   Celiac screen: --    Key Antihyperglycemic Medications            NOVOLOG 100 UNIT/ML injection vial    Sig: Use up to a total daily dose of 60 units qd via insulin pump    insulin lispro (HUMALOG) 100 UNIT/ML SOLN injection vial    Sig: Inject up to a total daily dose of 60 units qd as directed via insulin pump    Cosign for Ordering: Accepted by Rockne Menghini, MD on 06/13/2022  8:10 PM    Insulin Glargine-yfgn (SEMGLEE, YFGN,) 100 UNIT/ML SOPN    Sig: INJECT UP TO 22 UNITS EVERY DAY    Notes to Pharmacy: 15 prefilled pens           Key Hyperlipidemia Meds       The patient is on no antihyperlipidemia meds.           Key Anti-Hypertensive Meds            lisinopril (PRINIVIL;ZESTRIL) 5 MG tablet    Class: Historical Med                INSULIN SAFETY    Carbohydrate  source to treat hypoglycemia glucagon Medical alert  Rotates injection sites                 ROS  Information updated on 02/07/2022   Chest pain No   SOB No   Claudication No   Neuropathy No   Change in visions No   Orthostatic symptoms not assessed      Up to date with dental care not assessed      Dietary History:   Breakfast green monster smooth- kale/ banana/ almond butter   Lunch  : chicken./ sweet potoate   Dinner : salad/ chcien or fish/roasted vegatable   Exercise:not assessed     Patient Active Problem List   Diagnosis    Acute renal failure (ARF) ([place])    Internal derangement of knee    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)    Gustatory hyperhidrosis    Nephropathy screen    Acne excoriee    AKI (acute kidney injury) ([place])    Anxiety    BMI between 19-24,adult  Diffuse goiter    Dysautonomia-like disorder    Elevated blood pressure reading without diagnosis of hypertension    Fibromyalgia affecting multiple sites    Hashimoto's thyroiditis    Pre-syncope    Orthostatic hypotension    Nexplanon in place    Renal insufficiency syndrome    Stage 3a chronic kidney disease (CKD) (HCC)    Syncope    Abnormal TSH    Graves disease      Family History   Problem Relation Age of Onset    Atrial Fibrillation Mother     Other Father       Current Outpatient Medications   Medication Sig Dispense Refill    lisinopril (PRINIVIL;ZESTRIL) 5 MG tablet Take 1 tablet by mouth daily      NOVOLOG 100 UNIT/ML injection vial Use up to a total daily dose of 60 units qd via insulin pump 60 mL 3    methIMAzole (TAPAZOLE) 5 MG tablet 1/2 tab qd 15 tablet 1    insulin lispro (HUMALOG) 100 UNIT/ML SOLN injection vial Inject up to a total daily dose of 60 units qd as directed via insulin pump 60 mL 2    Continuous Blood Gluc Transmit (DEXCOM G6 TRANSMITTER) MISC DEVICE FOR CONTINUOUS GLUCOSE MONITORING CHANGE EVERY 3 MONTHS 1 each 3    Continuous Blood Gluc Sensor (DEXCOM G6 SENSOR) MISC Change every  10 days 10 each 3    Insulin Disposable Pump (OMNIPOD 5 G6 POD, GEN 5,) MISC Change every 3 days 10 each 3    Insulin Disposable Pump (OMNIPOD 5 G6 INTRO, GEN 5,) KIT Use as directed 1 kit 0    escitalopram (LEXAPRO) 10 MG tablet Take 1 tablet by mouth daily      Insulin Glargine-yfgn (SEMGLEE, YFGN,) 100 UNIT/ML SOPN INJECT UP TO 22 UNITS EVERY DAY 45 mL 1    Insulin Pen Needle 31G X 5 MM MISC Use up to 8 per day to inject insulin 800 each 3    blood glucose test strips (ACCU-CHEK GUIDE) strip Use to test blood glucose levels up to five times daily. 200 each 3    blood glucose test strips (ACCU-CHEK GUIDE) strip Use 5-6 times daily to check blood sugars 600 each 3     No current facility-administered medications for this visit.           lantus 22 units every day   ISF  1: 50   I:CHO 1:12(tdd 18   I reviewed the following labs with Oneita Kras    Hemoglobin A1C   Date Value Ref Range Status   02/02/2022 5.6 4.0 - 6.0 % Final     Comment:     Reference Range  Diabetic >=6.5%  Prediabetes  5.7-6.4%  Normal       <5.7%  The American Diabetes Association recommends an A1C goal of <7% for most adults with diabetes.       Lab Results   Component Value Date    CHOL 161 02/02/2022     Lab Results   Component Value Date    TRIG 61 02/02/2022     Lab Results   Component Value Date    HDL 54 02/02/2022     Lab Results   Component Value Date    LDLCHOLESTEROL 94.8 02/02/2022     No results found for: "LABVLDL", "VLDL"  Lab Results   Component Value Date    CHOLHDLRATIO 3.0 02/02/2022     Lab Results   Component  Value Date    NA 140 02/02/2022    K 4.7 02/02/2022    CL 109 (H) 02/02/2022    CO2 23 02/02/2022    BUN 38 (H) 02/02/2022    CREATININE 1.51 (H) 02/02/2022    GLUCOSE 89 02/02/2022    CALCIUM 8.9 02/02/2022    PROT 6.8 02/02/2022    LABALBU 4.1 02/02/2022    BILITOT 0.28 02/02/2022    ALKPHOS 100 02/02/2022    AST 49 (H) 02/02/2022    ALT 52 (H) 02/02/2022    LABGLOM 48 (L) 02/02/2022    GFRAA >60 03/27/2019     AGRATIO 0.8 (L) 03/26/2019       No results found for: "MALB24HUR"        No results found for: CHOL, CHOLPOCT, CHOLX, CHLST, CHOLV, HDL, HDLP, LDL, LDLC, DLDLP, TGLX, TRIGL, TRIGP, TGLPOCT, NTGLT, CHHD, CHHDX   No results found for: MCACR, MCA1, MCA2, MCA3, MCAU, MCAU2, MCALPOCT   There were no vitals taken for this visit.       Diabetic foot exam:   Left Foot:   Visual Exam: {Foot Inspection :37963::"normal"}   Pulse DP: {Foot Pulse DP:37964::"2+ (normal)"}   Filament test: {EXAM NEUROFILAMENT TEST:37965::"normal sensation"}   {Vibratory Sensation (Optional):37966::"normal"}  Right Foot:   Visual Exam: {Foot Inspection :37963::"normal"}   Pulse DP: {Foot Pulse DP:37964::"2+ (normal)"}   Filament test: {EXAM NEUROFILAMENT TEST:37965::"normal sensation"}   {Vibratory Sensation (Optional):37966::"normal"}                     1. 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 Nephropathy screen  3. Stage 3a chronic kidney disease (CKD) ([place])  4. Graves disease

## 2022-08-17 ENCOUNTER — Inpatient Hospital Stay: Payer: PRIVATE HEALTH INSURANCE | Attending: Internal Medicine | Primary: Internal Medicine

## 2022-08-17 ENCOUNTER — Encounter: Payer: PRIVATE HEALTH INSURANCE | Primary: Internal Medicine

## 2022-08-18 ENCOUNTER — Inpatient Hospital Stay: Admit: 2022-08-18 | Payer: PRIVATE HEALTH INSURANCE | Attending: Internal Medicine | Primary: Internal Medicine

## 2022-08-18 ENCOUNTER — Encounter: Payer: PRIVATE HEALTH INSURANCE | Primary: Internal Medicine

## 2022-08-18 DIAGNOSIS — R7989 Other specified abnormal findings of blood chemistry: Secondary | ICD-10-CM

## 2022-08-18 MED ORDER — SODIUM IODIDE I-123 7.4 MBQ PO CAPS
7.4 MBQ | Freq: Once | ORAL | Status: AC
Start: 2022-08-18 — End: 2022-08-18
  Administered 2022-08-18: 13:00:00 200 via ORAL

## 2022-08-19 ENCOUNTER — Ambulatory Visit: Payer: PRIVATE HEALTH INSURANCE | Primary: Internal Medicine

## 2022-08-19 NOTE — Telephone Encounter (Signed)
Kathryn [name] called  - !23 thyroid scan reviewed. Nl uptake suggestive Graves disease. Remains on MMI. Reviewed agranulocytosis risks and precautions. Discussed that she is due for labs  - blood sugars reviewed. No current data. Waiting for new Dexcom Sensor  - needs to set up a follow up appt

## 2022-09-01 NOTE — Telephone Encounter (Signed)
Lm w/pt to book a follow up appt with Dr Bennett Scrape

## 2022-09-05 NOTE — Telephone Encounter (Signed)
Noted. Please send her a letter if she does not respond to call to set up a follow up appt

## 2022-09-12 ENCOUNTER — Encounter

## 2022-09-12 NOTE — Telephone Encounter (Signed)
Patient's name and date of birth verified at start of call.   Incoming refill request.  Caller has been notified that we require a minimum of 2 business days for refill processing. (This does not include weekends, holidays, etc.)      Kathryn [name]  [birth year]      Confirmed best contact number:   Home Phone [phone] (home)    Medications Requested:  Requested Prescriptions     Pending Prescriptions Disp Refills    methIMAzole (TAPAZOLE) 5 MG tablet 15 tablet 1     Sig: 1/2 tab qd       Preferred Pharmacy:   [id number] [place], [place] [phone] [phone]  [place] [address]  Phone: [phone] Fax: [phone]    Has patient already contacted pharmacy to confirm no refills were on file: No    Notes for office regarding medication request:      QTY: 45.0    Other instructions and notes:  Last visit with provider: 02/07/2022  Next scheduled visit with provider: Visit date not found  *If next visit is not scheduled, book next needed visit or document if recall was added to list prior to sending for processing*  Inform patient that this needs to be on file before sending request as we do require for them to remain up-to-date with their recommended healthcare in order to avoid any interruptions in our ability to provide ongoing care such as refills.     Patient MyChart Status:  For Active Patients - Patient has been notified that they will receive an automated notification via MyChart once their script has been processed.   For Inactive Patients - Patient is aware that we have a patient portal, MyChart, which offers many benefits such as being able to request their refills electronically and receive automated notifications when scripts are processed.  In addition, they can schedule and manage appointments, view their testing results and visit notes, and stay connected with their care team.  Patient offered MyChart today.  Patient declined.  (Send link for set  up if accepted)

## 2022-09-12 NOTE — Telephone Encounter (Signed)
Please call pt to schedule appointment.    No protocol to follow.    Medications Requested:  Requested Prescriptions     Pending Prescriptions Disp Refills    methIMAzole (TAPAZOLE) 5 MG tablet 15 tablet 1     Sig: 1/2 tab qd       Preferred Pharmacy:   [id number] [place] [place] - [place] [phone] [address] [phone]  [place] [address]  Phone: [phone] Fax: [phone]        Prescription Refill Protocol reviewed: Yes    Allergy List Reviewed and Verified: Yes    Possible medication to medication interactions reviewed: Yes    Last appt @  Office: 02/07/2022     No future appointments.    MOST RECENT BLOOD PRESSURES  BP Readings from Last 3 Encounters:   02/07/22 130/76   01/14/21 108/70         MOST RECENT LAB DATA  Lab Results   Component Value Date/Time    K 4.7 02/02/2022 02:52 PM    ALT 52 02/02/2022 02:52 PM    TSH 0.008 04/22/2022 10:07 AM    CHOL 161 02/02/2022 02:52 PM    HGB 10.5 02/02/2022 02:52 PM    HCT 31.4 02/02/2022 02:52 PM

## 2022-10-09 ENCOUNTER — Encounter

## 2022-10-10 NOTE — Telephone Encounter (Signed)
Rx refused as patient needs to schedule follow up appointment. Patient has not responded to phone calls or letter mailed 09/06/2022. Patient is also due for labs, orders are available for patient to complete.

## 2022-10-14 NOTE — Telephone Encounter (Signed)
Pt needs to schedule appt before script can be sent

## 2022-10-18 NOTE — Telephone Encounter (Signed)
When appointment is made then you can route back to [name].

## 2022-10-19 ENCOUNTER — Encounter

## 2022-10-19 MED ORDER — OMNIPOD 5 G6 PODS (GEN 5) MISC
3 refills | Status: DC
Start: 2022-10-19 — End: 2022-12-16

## 2022-10-19 NOTE — Telephone Encounter (Signed)
Please schedule appointment with pt so refills can be done.    Last appointment 02/07/22    Medications Requested:  Requested Prescriptions     Pending Prescriptions Disp Refills    Insulin Disposable Pump (OMNIPOD 5 G6 PODS, GEN 5,) MISC 10 each 3     Sig: Change every 3 days       Preferred [facility]acy[facility]   [id number] [phone] [address] Phone: [phone]: [phone]     Prescription Refill Protocol reviewed: Yes    Allergy List Reviewed and Verified: Yes    Possible medication to medication interactions reviewed: Yes    Last appt @  Office: 02/07/2022     No future appointments.    MOST RECENT BLOOD PRESSURES  BP Readings from Last 3 Encounters:   02/07/22 130/76   01/14/21 108/70         MOST RECENT LAB DATA  Lab Results   Component Value Date/Time    K 4.7 02/02/2022 02:52 PM    ALT 52 02/02/2022 02:52 PM    TSH 0.008 04/22/2022 10:07 AM    CHOL 161 02/02/2022 02:52 PM    HGB 10.5 02/02/2022 02:52 PM    HCT 31.4 02/02/2022 02:52 PM

## 2022-10-19 NOTE — Telephone Encounter (Signed)
Orders Placed This Encounter    Insulin Disposable Pump (OMNIPOD 5 G6 PODS, GEN 5,) MISC     Sig: Change every 3 days     Dispense:  10 each     Refill:  3

## 2022-11-09 NOTE — Telephone Encounter (Signed)
Pt has not responded to several calls and a letter asking her to call the office to schedule sent on 09/06/22.

## 2022-11-09 NOTE — Telephone Encounter (Signed)
Lm w/pt to book follow up appt with Dr Bennett Scrape

## 2022-12-06 ENCOUNTER — Telehealth

## 2022-12-06 NOTE — Telephone Encounter (Signed)
Patient called she stated she stopped taking the methimazole in the middle of Jan/2024 because it was giving her really bad hand cramps, feet cramps,  Torso cramps, neck cramps.      Return call: [phone]

## 2022-12-07 NOTE — Telephone Encounter (Signed)
[name] called. Message left that I have called  Intended message: -   - since he is stop methimazole would advise repeating thyroid function test to see if he is still hyperthyroid  - also due for [facility] / lipids  -[name] next appointment is on 07/06/2023. Would try to move up date of her next appt   Orders Placed This Encounter    T4, Free     Standing Status:   Future     Standing Expiration Date:   12/07/2023    TSH     Standing Status:   Future     Standing Expiration Date:   12/07/2023    Comprehensive Metabolic Panel     Standing Status:   Future     Standing Expiration Date:   12/07/2023    Hemoglobin A1C     Standing Status:   Future     Standing Expiration Date:   12/07/2023    Lipid Panel     Standing Status:   Future     Standing Expiration Date:   12/07/2023    Microalbumin / Creatinine Urine Ratio     Standing Status:   Future     Standing Expiration Date:   12/07/2023

## 2022-12-07 NOTE — Telephone Encounter (Signed)
Patient calling to inform provider on her own has d/c methimazole. Will advise provider.

## 2022-12-08 NOTE — Telephone Encounter (Signed)
Patient is returning Dr. Helmut Muster call.     Return call: [phone]

## 2022-12-09 NOTE — Telephone Encounter (Addendum)
Return call to patient, voicemail reached. Message left requesting return call. Also sent detailed mychart message to patient. Lab orders printed and mailed to patient. Patient is currently scheduled to see nurse practitioner 12/13/22

## 2022-12-12 NOTE — Telephone Encounter (Signed)
Call to patient no answer, left a message for patient to call office back.

## 2022-12-12 NOTE — Patient Instructions (Signed)
Things to do after your visit today  Medications:   Lisinopril 5 mg daily   PTU 50 mg once a day   Novolog via OmniPod 5     Ask nephrology about fish oil and increasing lisinopril    Call office for sore thyroid with fever >100.5, right upper quadrant abdominal pain, jaundice (yellowing of skin and/or eyes) or clay-colored stool.     Check blood sugars: on Dexcom     Send glucose logs in: 8-10 weeks     [facility]  4-6 weeks, non-fasting  Please try to have your labs drawn 3-5 days prior to your next appointment.  Fasting is required    Follow-up appointment will be in 4 months    To schedule or change an appointment, for any questions, or to leave a message please call [phone]    If you have not set up the patient portal please consider doing so.    Web sites  Diabetes.org  ShoppingLesson.hu  [email or web address]-macias.[email or web address]  JDRF.org    Glucose monitoring information    I strongly encourage you to keep a glucose log book.  Record the date, time of test, results, medication dosing and any comments related to what you think may have affected your blood sugar level.  This includes exercise, illness, stress, restaurant foods, snacks, etc.    If you utilize a continuous glucose monitor and/or insulin pump, please try to download it every 6-8 weeks if it is not already connected to the practice online. If you would like your glucose levels to be reviewed due to a period of illness or your glucose levels are continuously out of the normal range despite taking your medication, watching your diet, and exercise, please download the information and let us know you have down so.    Hypoglycemia (low glucose level)  Are levels below 70. You may feel symptoms of sweating, shakiness, nervousness, pounding heart, weakness, confusion, lightheadedness, grouchy or sleepiness. If you feel these symptoms YOU MUST CHECK YOUR BLOOD SUGAR.   Causes of hypoglycemia including: skipped or delayed meal, increased physical  activity or exercise, too much or incorrect type of diabetic medication.     If blood glucose is below 70 or any symptoms are felt do ONE of the following:   4 glucose tablets or 1 tube of glucose gel    1 cup milk  4 oz. Juice (Do not take orange juice if you have kidney failure)    4 oz. can regular (non-diet) soda.    Wait 15 minutes. Recheck blood sugar. If still below 70, repeat treatment. When feeling better, eat 1/2 sandwich or drink 1 cup milk OR if it is less than one hour before mealtime, eat the meal. Call physician if still not feeling better after 30 minutes or if blood sugar stays below 70    Please remember the office policy for prescription refills is 3 business days    Please be aware it may take 1-2 weeks for labs results to be mailed.     If you are on any injectable medications:

## 2022-12-13 ENCOUNTER — Inpatient Hospital Stay: Admit: 2022-12-13 | Payer: PRIVATE HEALTH INSURANCE | Primary: Internal Medicine

## 2022-12-13 ENCOUNTER — Ambulatory Visit: Admit: 2022-12-13 | Discharge: 2022-12-13 | Payer: PRIVATE HEALTH INSURANCE | Primary: Internal Medicine

## 2022-12-13 DIAGNOSIS — E1043 Type 1 diabetes mellitus with diabetic autonomic (poly)neuropathy: Secondary | ICD-10-CM

## 2022-12-13 LAB — MICROALBUMIN URINE (EXT)
Creatinine, urine, random (INT/EXT): 65.6 mg/dL (ref 29–226)
MICROALBUMIN, URINE (EXT): 138 mg/L — ABNORMAL HIGH (ref 0–20.00)
Microalbumin/Creatinine Ratio Urine (EXT): 210 mg/g — ABNORMAL HIGH (ref <30)

## 2022-12-13 LAB — HEMOGLOBIN A1C
Estimated Average Glucose mg/dL (INT/EXT): 131 mg/dL
Estimated Avg Glucose: 131 mg/dL
HEMOGLOBIN A1C % (INT/EXT): 6.2 % — ABNORMAL HIGH (ref 4.0–6.0)
Hemoglobin A1C: 6.2 % — ABNORMAL HIGH (ref 4.0–6.0)

## 2022-12-13 LAB — MICROALBUMIN / CREATININE URINE RATIO
Creatinine, Ur: 65.6 mg/dL (ref 29–226)
Microalbumin Creatinine Ratio: 210 mg/g — ABNORMAL HIGH (ref <30)
Microalbumin, Random Urine: 138 mg/L — ABNORMAL HIGH (ref 0–20.00)

## 2022-12-13 LAB — COMPREHENSIVE METABOLIC PANEL
ALT: 29 U/L (ref 0–35)
AST: 33 U/L (ref 0–35)
Albumin/Globulin Ratio: 1.5 (ref 1.0–3.0)
Albumin: 4.4 g/dL (ref 3.5–5.2)
Alk Phosphatase: 63 U/L (ref 35–104)
Anion Gap: 11 mmol/L (ref 5–15)
BUN: 46 MG/DL — ABNORMAL HIGH (ref 6–20)
Bun/Cre Ratio: 28 — ABNORMAL HIGH (ref 12–20)
CO2: 20 mmol/L — ABNORMAL LOW (ref 22–29)
Calcium: 9.3 MG/DL (ref 8.6–10.0)
Chloride: 107 mmol/L (ref 98–107)
Creatinine: 1.67 MG/DL — ABNORMAL HIGH (ref 0.51–0.95)
Est, Glom Filt Rate: 42 mL/min/{1.73_m2} — ABNORMAL LOW (ref >60)
Glucose: 112 mg/dL — ABNORMAL HIGH (ref 74–109)
Potassium: 4.8 mmol/L (ref 3.5–5.1)
Sodium: 138 mmol/L (ref 136–145)
Total Bilirubin: 0.35 mg/dL (ref 0–1.20)
Total Protein: 7.3 g/dL (ref 6.4–8.3)

## 2022-12-13 LAB — LIPID PANEL
Chol/HDL Ratio: 3.8
Cholesterol: 165 MG/DL (ref <200)
HDL: 44 MG/DL — ABNORMAL LOW (ref >50)
LDL Cholesterol: 105.8 MG/DL — ABNORMAL HIGH (ref <100)
Non-HDL Cholesterol: 121 mg/dL (ref <130)
Triglycerides: 76 MG/DL (ref 0–150)

## 2022-12-13 LAB — PREGNANCY, URINE: HCG(Urine) Pregnancy Test: NEGATIVE

## 2022-12-13 LAB — T3, FREE: T3, Free: 4.77 pg/mL — ABNORMAL HIGH (ref 2.00–4.40)

## 2022-12-13 LAB — TSH: TSH: <0.005 u[IU]/mL — ABNORMAL LOW (ref 0.270–4.200)

## 2022-12-13 LAB — T4, FREE: T4 Free: 2.1 ng/dL — ABNORMAL HIGH (ref 0.9–1.7)

## 2022-12-13 MED ORDER — PROPYLTHIOURACIL 50 MG PO TABS: 50 MG | ORAL_TABLET | ORAL | 1 refills | Status: DC

## 2022-12-13 NOTE — Progress Notes (Signed)
History of present illness: Kathryn [name] is a 32 y.o. female patient with diabetes and hyperthyroidism who presents for follow-up visit, initial visit with me.    She has a history of type 1 diabetes mellitus initially diagnosed at age 83.  At that time she presented to the emergency department with diabetic ketoacidosis.  She does have a noted history of withholding insulin therapy to help control her weight.  She also has a history suppressed TSH levels due to Graves disease.  Patient underwent a thyroid uptake and scan in late December 2023 with results suggestive of Graves disease.  Prior to the scan she had been on methimazole therapy, however self discontinued the medication and January 2024 due to diffuse cramping she was experiencing.    Change in health status since last office visit: reaction to methimazole    Overall reports feeling: not great    Diabetes type: TYPE 1    Hypoglycemia: 1%           Safety: Medical ID: no.  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  no sore throat, no hoarseness, no  tinnitus, no hearing loss  Cardiovascular: no chest pain, no dyspnea on exertion, no orthopnea or PND, no palpitation and no edema, no pain in legs walking  Pulmonary: no coughing, no wheezing, no chest congestion  Skin: no ulcers.  no hair loss, no change to skin, nails or athlete's foot, no rash, no itching  GI: no reflux symptoms, no nausea, no vomiting, no gastroparesis, + constipation, no diarrhea. good appetite  Neurologic: no paresthesia to the hands or feet and no sensory loss of the hands or feet.  no headache, no dizziness, no speech changes.  denies hypoglycemic unawareness  M/S: no proximal muscle weakness.  no back pain, no neck pain, + "hips" joint pain, no muscle pain.  Lymphatic:  no abnormal bleeding.  no abnormal bruising   GU: no polydipsia, no polyuria, no polyphagia. no nocturia.  no painful urination, no blood in urine, no difficulty with urination  Psychiatric: + depressed mood, + anxiety.  mild difficulty with focus or concentration  Endocrine: no hot/+ cold intolerance.  no neck pain, no pressure.      Tetracycline and Tetracycline hcl    Current Outpatient Medications:     Insulin Disposable Pump (OMNIPOD 5 G6 PODS, GEN 5,) MISC, Change every 3 days, Disp: 10 each, Rfl: 3    lisinopril (PRINIVIL;ZESTRIL) 5 MG tablet, Take 1 tablet by mouth daily, Disp: , Rfl:  NOVOLOG 100 UNIT/ML injection vial, Use up to a total daily dose of 60 units qd via insulin pump, Disp: 60 mL, Rfl: 3    methIMAzole (TAPAZOLE) 5 MG tablet, 1/2 tab qd, Disp: 15 tablet, Rfl: 1    insulin lispro (HUMALOG) 100 UNIT/ML SOLN injection vial, Inject up to a total daily dose of 60 units qd as directed via insulin pump, Disp: 60 mL, Rfl: 2    Continuous Blood Gluc Transmit (DEXCOM G6 TRANSMITTER) MISC, DEVICE FOR CONTINUOUS GLUCOSE MONITORING CHANGE EVERY 3 MONTHS, Disp: 1 each, Rfl: 3    Continuous Blood Gluc Sensor (DEXCOM G6 SENSOR) MISC, Change every 10 days, Disp: 10 each, Rfl: 3    Insulin Disposable Pump (OMNIPOD 5  G6 INTRO, GEN 5,) KIT, Use as directed, Disp: 1 kit, Rfl: 0    Insulin Glargine-yfgn (SEMGLEE, YFGN,) 100 UNIT/ML SOPN, INJECT UP TO 22 UNITS EVERY DAY, Disp: 45 mL, Rfl: 1    Insulin Pen Needle 31G X 5 MM MISC, Use up to 8 per day to inject insulin, Disp: 800 each, Rfl: 3    blood glucose test strips (ACCU-CHEK GUIDE) strip, Use to test blood glucose levels up to five times daily., Disp: 200 each, Rfl: 3    blood glucose test strips (ACCU-CHEK GUIDE) strip, Use 5-6 times daily to check blood sugars, Disp: 600 each, Rfl: 3  Past Medical History:   Diagnosis Date    Diabetes 1.5, managed as type 1 (HCC)     Fibromyalgia      Past Surgical History:   Procedure Laterality Date    OTHER SURGICAL HISTORY      mother states no history of surgery     Social History     Socioeconomic History    Marital status: Single     Spouse name: Not on file    Number of children: Not on file    Years of education: Not on file    Highest education level: Not on file   Occupational History    Not on file   Tobacco Use    Smoking status: Never    Smokeless tobacco: Current   Substance and Sexual Activity    Alcohol use: Not Currently    Drug use: Yes     Types: Marijuana Sheran Fava)    Sexual activity: Not on file   Other Topics Concern    Not on file   Social History Narrative    Not on file     Social Determinants of Health     Financial Resource Strain: Not on file   Food Insecurity: Not on file   Transportation Needs: Not on file   Physical Activity: Not on file   Stress: Not on file   Social Connections: Not on file   Intimate Partner Violence: Not on file   Housing Stability: Not on file     Family History   Problem Relation Age of Onset    Atrial Fibrillation Mother     Other Father      Component      Latest Ref Rng 02/02/2022 04/22/2022 12/13/2022   Sodium      136 - 145 mmol/L   138    Potassium      3.5 - 5.1 mmol/L   4.8    Chloride      98 - 107 mmol/L   107    CARBON DIOXIDE      22 - 29 mmol/L   20 (L)  Anion Gap      5 - 15  mmol/L   11    Glucose, Random      74 - 109 mg/dL   161 (H)    BUN,BUNPL      6 - 20 MG/DL   46 (H)    Creatinine      0.51 - 0.95 MG/DL   0.96 (H)    Bun/Cre Ratio      12 - 20    28 (H)    Est, Glom Filt Rate      >60 ml/min/1.2m2   42 (L)    Calcium, Total      8.6 - 10.0 MG/DL   9.3    Total Bilirubin      0 - 1.20 mg/dL   0.45    ALT      0 - 35 U/L   29    AST      0 - 35 U/L   33    Alk Phos      35 - 104 U/L   63    Total Protein, Serum      6.4 - 8.3 g/dL   7.3    Albumin      3.5 - 5.2 g/dL   4.4    ALBUMIN/GLOBULIN RATIO      1.0 - 3.0     1.5    Cholesterol, Total      <200 MG/DL   409    Triglycerides      0 - 150 MG/DL   76    HDL Cholesterol      >50 MG/DL   44 (L)    LDL Cholesterol      <100 MG/DL   811.9 (H)    Chol/HDL Ratio   3.8    Non-HDL Cholesterol      <130 mg/dL   147    Microalbumin, Random Urine      0 - 20.00 mg/L   138.00 (H)    Creatinine, Ur      29 - 226 mg/dL   82.95    Microalb/Creat Ratio POC      <30 mg/g   210 (H)    Hemoglobin A1C      4.0 - 6.0 % 5.6   6.2 (H)    eAG (mg/dL)      mg/dL 621   308    T4 Free      0.9 - 1.7 ng/dL  1.4  2.1 (H)    TSH      0.270 - 4.200 uIU/mL  0.008 (L)  <0.005 (L)    T3,FREE,FT3      2.00 - 4.40 pg/mL  3.01  4.77 (H)        PHYSICAL EXAM  Vitals:    12/13/22 1054   BP: 128/64   Site: Left Upper Arm   Position: Sitting   Cuff Size: Medium Adult   Pulse: 80   Weight: 79.8 kg (176 lb)   Height: 1.727 m (5\' 8" )     General appearance: alert, well appearing, and in no distress, weight: Overweight  Mental status: alert, oriented to person, place, and time, normal mood, behavior, speech, dress, motor activity, and thought processes  Neck: Supple, no palpable cervical lymph nodes  Thyroid: normal size, non-tender.  No goiter or nodules palpable.    CV: Regular rate and rhythm.  No murmur, rub or gallop  Resp: Clear to ausculation without wheezing, rales or rhonchi  Neuro: CN II-XII intact, no focal weakness.  Gait normal.  DTR normal.  Skin: Normal  temperature and texture.   Lipodystrophy no, skin breakdown no.  There are no abnormal callus formations on the feet.  There are no ulcerations or skin lesions present.  The interdigital web spaces free of tenia  Abdomen: soft, NTND + BS x4.  Extremities: Good tone.  no deformities, no cyanosis, no edema noted.  no Charcot foot  HEENT: PERRLA, Oropharynx benign, mucus membranes pink and moist.  Good dentition   Stare: none  Lid Lag: none  EOMI: intact  Fundi: not assessed       Diabetic foot exam:   Left Foot:              Visual Exam: normal              Pulse DP: 2+ (normal)              Filament test: normal sensation              Vibratory Sensation: normal  Right Foot:              Visual Exam: normal              Pulse DP: 2+ (normal)              Filament test: normal sensation              Vibratory Sensation: normal    ASSESSMENT AND PLAN  Diabetes type 1 with no complications:  Hemoglobin A1c in good range at 6.2%. Continuous glucose monitor evaluation reviewed during office visit.  CGMS active time is 91.8%.  Average glucose is 125 with GMI of 6.3% and coefficient of variation of 27.9%.  Glucose levels are very high 1%, high 3%, target range 95%, low 1% and very low 0%.  Overall glucose levels are in very good range.  Does have occasional glucose excursions, typically related to underestimation of carbohydrates or late administration of bolus insulin.  Currently at a 42/58 basal/bolus split, discussed ideally having this be 50/50 split.  Patient does also have 18% override boluses.  Encouraged patient to use programmed pump settings, especially for carbohydrates.  For now continue with current pump settings given overall glycemic control.  Hypertension:  Blood pressure in good range at today's visit.  Urine microalbumin increased from last check.  Currently on lisinopril 5 mg daily, however kidney function has declined slightly since last check as well.  Given declining kidney function, have asked patient  to check with Nephrology if it is safe for her to increase her lisinopril dose given increased microalbumin urea.  Patient verbalized understanding.  Hyperlipidemia:  LDL cholesterol increased slightly from last check.  Elevated at 105.8.  Discussed target LDL cholesterol for patients with diabetes to ideally be less than 70 to reduce risk of cardiac event.  Discussed continuing with consistent exercise and following healthy well-balanced diet, would consider slight reduction red meat consumption if possible.  Patient does not currently take fish oil supplementation, would recommend starting if possible to see if this helps improve fasting lipid levels.  Patient stated Nephrology instructed her to follow-up with them for any recommended supplementation.  Will have patient follow up with Nephrology regarding starting potential fish oil supplementation.  Hyperthyroid/Graves' Disease:  TSH is now on traceable, free T3 and free T4 were both elevated.  Patient discontinue her methimazole in mid January 2024 after worsening and intolerable diffuse cramping.  Reports cramping issues resolved after stopping the methimazole.  Discussed other potential options for treatments of hyperthyroidism, including trying PTU for medication option or potential ablation or surgical removal.  Also discussed selenium supplementation help cool down thyroid, 200 mcg per day or 2 [place] nuts daily.  Review of up-to-date information shows no negative affects of selenium supplementation on CKD unless patient is severely obese, which she is not.  Patient agreeable to trialing PTU, will have patient begin PTU 50 mg once per day for now to assess tolerance.  Plan to recheck thyroid function tests in about 4 weeks to see if there is any change in her thyroid function.  Reviewed signs and symptoms of agranulocytosis and liver toxicity and when to discontinue medication contact office.  Patient verbalized understanding and is in agreement with this  plan.      Follow up in the office in 4 months or sooner if any issues arise   Data that has been cut and pasted has been obtained from Epic Chart, Care Everywhere and Media  Visit time  35 minutes    This document was generated using the aid of voice recognition software. Please be aware that there may be inadvertent transcription errors not identified and corrected by the author.

## 2022-12-13 NOTE — Telephone Encounter (Signed)
Pt in to see NP today 12/13/22.

## 2022-12-13 NOTE — Addendum Note (Signed)
Addended by: Malcolm Metro on: 12/13/2022 01:27 PM     Modules accepted: Orders

## 2022-12-16 ENCOUNTER — Telehealth

## 2022-12-16 MED ORDER — OMNIPOD 5 G6 PODS (GEN 5) MISC
3 refills | Status: AC
Start: 2022-12-16 — End: 2023-06-10

## 2022-12-16 NOTE — Telephone Encounter (Signed)
Requests refill for OP5  Orders Placed This Encounter    Insulin Disposable Pump (OMNIPOD 5 G6 PODS, GEN 5,) MISC     Sig: Change every 3 days     Dispense:  10 each     Refill:  3

## 2023-01-07 ENCOUNTER — Encounter

## 2023-01-09 NOTE — Telephone Encounter (Signed)
No protocol to follow  Next appt  04/03/23    Medications Requested:  Requested Prescriptions     Pending Prescriptions Disp Refills    propylthiouracil (PTU) 50 MG tablet [Pharmacy Med Name: PROPYLTHIOURACIL 50 MG TABLET] 90 tablet 1     Sig: TAKE 1 TABLET BY MOUTH EVERY DAY       Preferred Pharmacy:   [facility] [address], [place] - [address] [place] - P [phone] Carmon Ginsberg [phone]  [address] [place]  [place] [address]  Phone: [phone] Fax: [phone]          Prescription Refill Protocol reviewed: Yes    Allergy List Reviewed and Verified: Yes    Possible medication to medication interactions reviewed: Yes    Last appt @  Office: 12/13/2022     Future Appointments   Date Time Provider Department Center   04/03/2023  8:30 AM Malcolm Metro, APRN - NP [facility]   07/06/2023  3:00 PM Bobbye Riggs, MD [facility]   11/07/2023  8:00 AM Malcolm Metro, APRN - NP [facility]       MOST RECENT BLOOD PRESSURES  BP Readings from Last 3 Encounters:   12/13/22 128/64   02/07/22 130/76   01/14/21 108/70         MOST RECENT LAB DATA  Lab Results   Component Value Date/Time    K 4.8 12/13/2022 07:29 AM    ALT 29 12/13/2022 07:29 AM    TSH <0.005 12/13/2022 07:29 AM    CHOL 165 12/13/2022 07:29 AM    HGB 10.5 02/02/2022 02:52 PM    HCT 31.4 02/02/2022 02:52 PM

## 2023-01-10 MED ORDER — PROPYLTHIOURACIL 50 MG PO TABS
50 | ORAL_TABLET | Freq: Every day | ORAL | 0 refills | Status: DC
Start: 2023-01-10 — End: 2023-04-07

## 2023-02-17 NOTE — Telephone Encounter (Signed)
Incoming call from patient requesting refill of omnipod 5 g6 pods. Phone call to pharmacy to inquire if patient had refills remaining. Spoke with Granville Lewis who confirms patient does have refills available. [facility] aware [facility].

## 2023-03-18 ENCOUNTER — Encounter

## 2023-03-20 MED ORDER — DEXCOM G6 SENSOR MISC
3 refills | 52.50000 days | Status: DC
Start: 2023-03-20 — End: 2024-06-19

## 2023-03-20 NOTE — Telephone Encounter (Signed)
From: [name]  To: Dr. Bobbye Riggs  Sent: 03/18/2023 12:02 PM EDT  Subject: Dexcom Sensor Prescription    Hi Dr. Helmut Muster,    I was wondering if I can switch my dexcom sensor prescription to a 3 month, instead of a 1 month? Please let me know when you get a chance!    Thanks!  Maggie

## 2023-03-20 NOTE — Telephone Encounter (Signed)
Patient requesting 90 day supply for GMS sensors. Previous Rx appears to have been sent as qty 10 3 refills. I did send updated Rx to the pharmacy for CGM, patient may have to request a 90 day with the pharmacy rather then a 30 day. Patient aware

## 2023-03-31 NOTE — Patient Instructions (Incomplete)
Things to do after your visit today  Medications:   ***    Check blood sugars: on Dexcom     Send glucose logs in: as needed     Call office for sore thyroid with fever >100.5, right upper quadrant abdominal pain, jaundice (yellowing of skin and/or eyes) or clay-colored stool.    [facility]: Please have labs drawn by end of the week.  Please have your labs drawn 3-5 days prior to your next appointment.  Fasting is required  Please hold all biotin or biotin containing supplements for 5 days prior to thyroid levels    Follow-up appointment will be in 3 months    To schedule or change an appointment, for any questions, or to leave a message please call [phone]    If you have not set up the patient portal please consider doing so.    Glucose monitoring information    I strongly encourage you to keep a glucose log book.  Record the date, time of test, results, medication dosing and any comments related to what you think may have affected your blood sugar level.  This includes exercise, illness, stress, restaurant foods, snacks, etc.    Hypoglycemia (low glucose level)  Are levels below 70. You may feel symptoms of sweating, shakiness, nervousness, pounding heart, weakness, confusion, lightheadedness, grouchy or sleepiness. If you feel these symptoms YOU MUST CHECK YOUR BLOOD SUGAR.   Causes of hypoglycemia including: skipped or delayed meal, increased physical activity or exercise, too much or incorrect type of diabetic medication.     If blood glucose is below 70 or any symptoms are felt do ONE of the following:   4 glucose tablets or 1 tube of glucose gel    1 cup milk  4 oz. Juice (Do not take orange juice if you have kidney failure)    4 oz. can regular (non-diet) soda.    Wait 15 minutes. Recheck blood sugar. If still below 70, repeat treatment. When feeling better, eat 1/2 sandwich or drink 1 cup milk OR if it is less than one hour before mealtime, eat the meal. Call physician  if still not feeling better after 30 minutes or if blood sugar stays below 70      Please remember the office policy for prescription refills is 3 business days    Please be aware it may take 1-2 weeks for labs results to be mailed.     If you are on any injectable medications

## 2023-04-03 ENCOUNTER — Ambulatory Visit: Payer: PRIVATE HEALTH INSURANCE | Primary: Internal Medicine

## 2023-04-03 DIAGNOSIS — E1043 Type 1 diabetes mellitus with diabetic autonomic (poly)neuropathy: Secondary | ICD-10-CM

## 2023-04-03 NOTE — Progress Notes (Unsigned)
History of present illness: Kathryn [name] is a 32 y.o. female patient with  diabetes and hyperthyroidism who presents for follow-up visit     She has a history of type 1 diabetes mellitus initially diagnosed at age 14.  At that time she presented to the emergency department with diabetic ketoacidosis.  She does have a noted history of withholding insulin therapy to help control her weight.  She also has a history suppressed TSH levels due to Graves disease.  Patient underwent a thyroid uptake and scan in late December 2023 with results suggestive of Graves disease.  Prior to the scan she had been on methimazole therapy, however self discontinued the medication and January 2024 due to diffuse cramping she was experiencing.    Change in health status since last office visit: ***    Overall reports feeling: ***    Diabetes type: TYPE 1    Hypoglycemia: ***           Safety: Medical ID: no.  Carries Emergency CHO: yes  Glucagon: no    Education: During visit    Medication:   PTU 50 mg daily   Lisinopril 5 mg daily   Humalog via Omnipod 5  ***  ***    Previous Medications Used and Reason for Discontinuation:   Methimazole: Patient reported diffuse cramping, self discontinued     Glucose review:  Dexcom/Omnipod  ***  ***    Barriers to good control identified and discussed including:  Diabetes knowledge:  Needs improvement  Exercise: ***  Diet  Breakfast: ***  Lunch: ***  Supper: ***  Other factors: ***    Complications  Retinopathy:  No documentation of eye exam  Nephropathy: yes, CKD  Neuropathy: no  Cardiovascular: no  CVA: no  Autonomic dysfunction: no    Review of Systems  Constitutional:  *** fevers, *** chills, *** weight change, *** energy, *** fatigue  HEENT: *** blurred vision, *** loss of vision, *** difficulty with swallowing food or tablets.  *** sore throat, *** hoarseness, *** tinnitus, *** hearing loss  Cardiovascular: *** chest pain, *** dyspnea on exertion, *** orthopnea or PND, *** palpitation and ***  edema, *** pain in legs walking  Pulmonary: *** coughing, *** wheezing, *** chest congestion  Skin: *** ulcers.  *** hair loss, *** change to skin, nails or athlete's foot, *** rash, *** itching  GI: *** reflux symptoms, *** nausea, *** vomiting, *** gastroparesis, *** constipation, *** diarrhea. *** appetite  Neurologic: *** paresthesia to the hands or feet and *** sensory loss of the hands or feet.  *** headache, *** dizziness, *** speech changes.  *** hypoglycemic unawareness  M/S: *** proximal muscle weakness.  *** back pain, *** neck pain, *** joint pain, *** muscle pain.  Lymphatic:  *** abnormal bleeding.  *** abnormal bruising   GU: *** polydipsia, *** polyuria, *** polyphagia.  *** nocturia.  *** painful urination, *** blood in urine, *** difficulty with urination  Psychiatric: *** depressed mood, *** anxiety.  *** difficulty with focus or concentration  Endocrine: *** hot/*** cold intolerance.  *** neck pain, *** pressure.      Tetracycline and Tetracycline hcl    Current Outpatient Medications:     Continuous Glucose Sensor (DEXCOM G6 SENSOR) MISC, Change every 10 days, Disp: 9 each, Rfl: 3    propylthiouracil (PTU) 50 MG tablet, TAKE 1 TABLET BY MOUTH EVERY DAY, Disp: 90 tablet, Rfl: 0    Insulin Disposable Pump (OMNIPOD 5 G6 PODS, GEN 5,) MISC,  Change every 3 days, Disp: 10 each, Rfl: 3    lisinopril (PRINIVIL;ZESTRIL) 5 MG tablet, Take 1 tablet by mouth daily, Disp: , Rfl:     NOVOLOG 100 UNIT/ML injection vial, Use up to a total daily dose of 60 units qd via insulin pump, Disp: 60 mL, Rfl: 3    insulin lispro (HUMALOG) 100 UNIT/ML SOLN injection vial, Inject up to a total daily dose of 60 units qd as directed via insulin pump, Disp: 60 mL, Rfl: 2    Continuous Blood Gluc Transmit (DEXCOM G6 TRANSMITTER) MISC, DEVICE FOR CONTINUOUS GLUCOSE MONITORING CHANGE EVERY 3 MONTHS, Disp: 1 each, Rfl: 3    Insulin Disposable Pump (OMNIPOD 5 G6 INTRO, GEN 5,) KIT, Use as directed, Disp: 1 kit, Rfl: 0    Insulin  Glargine-yfgn (SEMGLEE, YFGN,) 100 UNIT/ML SOPN, INJECT UP TO 22 UNITS EVERY DAY, Disp: 45 mL, Rfl: 1    Insulin Pen Needle 31G X 5 MM MISC, Use up to 8 per day to inject insulin, Disp: 800 each, Rfl: 3    blood glucose test strips (ACCU-CHEK GUIDE) strip, Use to test blood glucose levels up to five times daily., Disp: 200 each, Rfl: 3    blood glucose test strips (ACCU-CHEK GUIDE) strip, Use 5-6 times daily to check blood sugars, Disp: 600 each, Rfl: 3  Past Medical History:   Diagnosis Date    Diabetes 1.5, managed as type 1 (HCC)     Fibromyalgia      Past Surgical History:   Procedure Laterality Date    OTHER SURGICAL HISTORY      mother states no history of surgery     Social History     Socioeconomic History    Marital status: Single     Spouse name: Not on file    Number of children: Not on file    Years of education: Not on file    Highest education level: Not on file   Occupational History    Not on file   Tobacco Use    Smoking status: Never    Smokeless tobacco: Current   Substance and Sexual Activity    Alcohol use: Not Currently    Drug use: Yes     Types: Marijuana Sheran Fava)    Sexual activity: Not on file   Other Topics Concern    Not on file   Social History Narrative    Not on file     Social Determinants of Health     Financial Resource Strain: Not on file   Food Insecurity: Not on file   Transportation Needs: Not on file   Physical Activity: Not on file   Stress: Not on file   Social Connections: Not on file   Intimate Partner Violence: Not on file   Housing Stability: Not on file     Family History   Problem Relation Age of Onset    Atrial Fibrillation Mother     Other Father      Component      Latest Ref Rng 02/02/2022 12/13/2022   Sodium      136 - 145 mmol/L  138    Potassium      3.5 - 5.1 mmol/L  4.8    Chloride      98 - 107 mmol/L  107    CARBON DIOXIDE      22 - 29 mmol/L  20 (L)    Anion Gap      5 - 15  mmol/L  11    Glucose      74 - 109 mg/dL  109 (H)    BUN,BUNPL      6 - 20 MG/DL  46 (H)     Creatinine      0.51 - 0.95 MG/DL  6.04 (H)    Bun/Cre      12 - 20   28 (H)    Est, Glom Filt Rate      >60 ml/min/1.79m2  42 (L)    Calcium      8.6 - 10.0 MG/DL  9.3    Total Bilirubin      0 - 1.20 mg/dL  5.40    ALT      0 - 35 U/L  29    AST      0 - 35 U/L  33    Alkaline Phosphatase      35 - 104 U/L  63    Total Protein      6.4 - 8.3 g/dL  7.3    Albumin      3.5 - 5.2 g/dL  4.4    Albumin/Globulin Ratio      1.0 - 3.0    1.5    Cholesterol, Total      <200 MG/DL  981    Triglycerides      0 - 150 MG/DL  76    HDL Cholesterol      >50 MG/DL  44 (L)    LDL Cholesterol      <100 MG/DL  191.4 (H)    Chol/HDL Ratio  3.8    Non-HDL Cholesterol      <130 mg/dL  782    Microalb, Ur      0 - 20.00 mg/L 81.20 (H)  138.00 (H)    Creatinine, Ur      29 - 226 mg/dL 956.21  30.86    Microalb/Creat Ratio POC      <30 mg/g 67 (H)  210 (H)    Hemoglobin A1C      4.0 - 6.0 %  6.2 (H)    eAG (mg/dL)      mg/dL  578    TSH, 3rd Generation      0.270 - 4.200 uIU/mL  <0.005 (L)    Triiodothyronine, Free, Serum      2.00 - 4.40 pg/mL  4.77 (H)    T4 Free      0.9 - 1.7 ng/dL  2.1 (H)      PHYSICAL EXAM  There were no vitals filed for this visit.  General appearance: alert, well appearing, and in no distress, weight: Overweight  Mental status: alert, oriented to person, place, and time, normal mood, behavior, speech, dress, motor activity, and thought processes  Neck: Supple, no palpable cervical lymph nodes  Thyroid: normal size, non-tender.  No goiter or nodules palpable.    CV: Regular rate and rhythm.  No murmur, rub or gallop  Resp: Clear to ausculation without wheezing, rales or rhonchi  Neuro: CN II-XII intact, no focal weakness.  Gait normal.  DTR normal.  Skin: Normal temperature and texture.   Lipodystrophy ***, skin breakdown ***.  There are *** abnormal callus formations on the feet.  There are *** ulcerations or skin lesions present.  The interdigital web spaces free of tenia  Abdomen: soft, NTND + BS  x4.  Extremities: Good tone.  *** deformities, *** cyanosis, *** edema noted.  *** Charcot foot  HEENT: PERRLA, Oropharynx  benign, mucus membranes pink and moist.  Good dentition   Stare: none  Lid Lag: none  EOMI: intact  Fundi: not assessed       Diabetic foot exam:   Left Foot:              Visual Exam: ***              Pulse DP: 2+ (normal)              Filament test: *** sensation              Vibratory Sensation: ***  Right Foot:              Visual Exam: ***              Pulse DP: 2+ (normal)              Filament test: *** sensation              Vibratory Sensation: ***    ASSESSMENT AND PLAN  Diabetes type 1 with nephropathy:  Hypertension:  Hyperlipidemia:  Hyperthyroid:       Follow up in the office in 3 months or sooner if any issues arise   Data that has been cut and pasted has been obtained from Epic Chart, Care Everywhere and Media  Visit time  {TIME; ACP time 16 min - 1 hour:28416::"<16 minutes (Non-Billable)"}    This document was generated using the aid of voice recognition software. Please be aware that there may be inadvertent transcription errors not identified and corrected by the author.

## 2023-04-06 ENCOUNTER — Inpatient Hospital Stay: Admit: 2023-04-06 | Payer: PRIVATE HEALTH INSURANCE | Primary: Internal Medicine

## 2023-04-06 DIAGNOSIS — E1043 Type 1 diabetes mellitus with diabetic autonomic (poly)neuropathy: Secondary | ICD-10-CM

## 2023-04-06 DIAGNOSIS — E0590 Thyrotoxicosis, unspecified without thyrotoxic crisis or storm: Secondary | ICD-10-CM

## 2023-04-06 LAB — HEMOGLOBIN A1C
Estimated Average Glucose mg/dL (INT/EXT): 120 mg/dL
Estimated Avg Glucose: 120 mg/dL
HEMOGLOBIN A1C % (INT/EXT): 5.8 % (ref 4.0–6.0)
Hemoglobin A1C: 5.8 % (ref 4.0–6.0)

## 2023-04-06 LAB — COMPREHENSIVE METABOLIC PANEL
ALT: 25 U/L (ref 0–35)
AST: 29 U/L (ref 0–35)
Albumin/Globulin Ratio: 1.7 (ref 1.0–3.0)
Albumin: 4.4 g/dL (ref 3.5–5.2)
Alk Phosphatase: 60 U/L (ref 35–104)
Anion Gap: 9 mmol/L (ref 5–15)
BUN/Creatinine Ratio: 18 (ref 12–20)
BUN: 31 mg/dL — ABNORMAL HIGH (ref 6–20)
CO2: 24 mmol/L (ref 22–29)
Calcium: 8.8 mg/dL (ref 8.6–10.0)
Chloride: 103 mmol/L (ref 98–107)
Creatinine: 1.72 mg/dL — ABNORMAL HIGH (ref 0.51–0.95)
Est, Glom Filt Rate: 40 mL/min/{1.73_m2} — ABNORMAL LOW (ref >60)
Glucose: 109 mg/dL (ref 74–109)
Potassium: 4.2 mmol/L (ref 3.5–5.1)
Sodium: 136 mmol/L (ref 136–145)
Total Bilirubin: 0.32 mg/dL (ref 0–1.20)
Total Protein: 7 g/dL (ref 6.4–8.3)

## 2023-04-06 LAB — CBC WITH AUTO DIFFERENTIAL
Basophils %: 1 %
Basophils Absolute: 0 10*3/uL (ref 0.0–0.1)
Eosinophils %: 3 %
Eosinophils Absolute: 0.2 10*3/uL (ref 0.0–0.5)
Hematocrit: 29.3 % — ABNORMAL LOW (ref 36.0–45.0)
Hemoglobin: 9.9 g/dL — ABNORMAL LOW (ref 11.8–14.8)
Immature Granulocytes %: 0 %
Immature Granulocytes Absolute: 0 10*3/uL (ref 0.00–0.03)
Lymphocytes Absolute: 2.2 10*3/uL (ref 1.2–3.7)
Lymphocytes: 38 %
MCH: 30.5 pg (ref 25.6–32.2)
MCHC: 33.8 g/dL (ref 32.2–35.5)
MCV: 90.2 FL (ref 80.0–95.0)
MPV: 10 FL (ref 9.4–12.4)
Monocytes %: 8 %
Monocytes Absolute: 0.5 10*3/uL (ref 0.2–0.8)
Neutrophils Absolute: 2.9 10*3/uL (ref 1.6–6.1)
Nucleated RBCs: 0 /100{WBCs} (ref 0–0.02)
Platelets: 209 10*3/uL (ref 150–400)
RBC: 3.25 M/uL — ABNORMAL LOW (ref 3.93–5.22)
RDW: 12.4 % (ref 11.6–14.4)
Seg Neutrophils: 50 %
WBC: 5.8 10*3/uL (ref 4.0–10.0)
nRBC: 0 10*3/uL

## 2023-04-06 LAB — T3, FREE: T3, Free: 2.48 pg/mL (ref 2.00–4.40)

## 2023-04-06 LAB — TSH: TSH: 0.08 u[IU]/mL — ABNORMAL LOW (ref 0.270–4.200)

## 2023-04-06 LAB — T4, FREE: T4 Free: 1.2 ng/dL (ref 0.9–1.7)

## 2023-04-06 NOTE — Patient Instructions (Incomplete)
Things to do after your visit today  Medications:   ***    Check blood sugars:  On Dexcom     Send glucose logs in:  As needed     Call office for sore thyroid with fever >100.5, right upper quadrant abdominal pain, jaundice (yellowing of skin and/or eyes) or clay-colored stool.     Pharmacy: to [facility]: Please have labs drawn in 4 weeks, not fasting.  Please try to have your labs drawn 3-5 days prior to your next appointment.  Fasting is required  Please hold all biotin or biotin containing supplements for 5 days prior to thyroid levels    Follow-up appointment will be in 3 months    To schedule or change an appointment, for any questions, or to leave a message please call [phone]    If you have not set up the patient portal please consider doing so.    Glucose monitoring information    I strongly encourage you to keep a glucose log book.  Record the date, time of test, results, medication dosing and any comments related to what you think may have affected your blood sugar level.  This includes exercise, illness, stress, restaurant foods, snacks, etc.    Hypoglycemia (low glucose level)  Are levels below 70. You may feel symptoms of sweating, shakiness, nervousness, pounding heart, weakness, confusion, lightheadedness, grouchy or sleepiness. If you feel these symptoms YOU MUST CHECK YOUR BLOOD SUGAR.   Causes of hypoglycemia including: skipped or delayed meal, increased physical activity or exercise, too much or incorrect type of diabetic medication.     If blood glucose is below 70 or any symptoms are felt do ONE of the following:   4 glucose tablets or 1 tube of glucose gel    1 cup milk  4 oz. Juice (Do not take orange juice if you have kidney failure)    4 oz. can regular (non-diet) soda.    Wait 15 minutes. Recheck blood sugar. If still below 70, repeat treatment. When feeling better, eat 1/2 sandwich or drink 1 cup milk OR if it is less than one hour before mealtime, eat the  meal. Call physician if still not feeling better after 30 minutes or if blood sugar stays below 70      Please remember the office policy for prescription refills is 3 business days    Please be aware it may take 1-2 weeks for labs results to be mailed.     If you are on any injectable medications

## 2023-04-07 ENCOUNTER — Ambulatory Visit: Admit: 2023-04-07 | Discharge: 2023-04-07 | Payer: PRIVATE HEALTH INSURANCE | Primary: Internal Medicine

## 2023-04-07 DIAGNOSIS — E1043 Type 1 diabetes mellitus with diabetic autonomic (poly)neuropathy: Secondary | ICD-10-CM

## 2023-04-07 MED ORDER — PROPYLTHIOURACIL 50 MG PO TABS
50 MG | ORAL_TABLET | Freq: Every day | ORAL | 1 refills | Status: DC
Start: 2023-04-07 — End: 2023-10-06

## 2023-04-07 NOTE — Progress Notes (Unsigned)
History of present illness: Kathryn [name] is a 32 y.o. female patient with diabetes and hyperthyroidism who presents for follow-up visit     She has a history of type 1 diabetes mellitus initially diagnosed at age 63.  At that time she presented to the emergency department with diabetic ketoacidosis.  She does have a noted history of withholding insulin therapy to help control her weight.  She also has a history suppressed TSH levels due to Graves disease.  Patient underwent a thyroid uptake and scan in late December 2023 with results suggestive of Graves disease.  Prior to the scan she had been on methimazole therapy, however self discontinued the medication and January 2024 due to diffuse cramping she was experiencing.    Change in health status since last office visit: ***    Overall reports feeling: ***    Diabetes type: TYPE 1    Hypoglycemia: ***           Safety: Medical ID: no.  Carries Emergency CHO: yes  Glucagon: no    Education: During visit    Medication:   PTU 50 mg daily   Lisinopril 5 mg daily   Humalog via Omnipod 5  ***  ***    Previous Medications Used and Reason for Discontinuation:   Methimazole: Patient reported diffuse cramping, self discontinued     Glucose review:  Dexcom/Omnipod  ***  ***    Barriers to good control identified and discussed including:  Diabetes knowledge:  Needs improvement  Exercise: ***  Diet  Breakfast: ***  Lunch: ***  Supper: ***  Other factors: ***    Complications  Retinopathy:  No documentation of eye exam  Nephropathy: yes, CKD  Neuropathy: no  Cardiovascular: no  CVA: no  Autonomic dysfunction: no    Review of Systems  Constitutional:  *** fevers, *** chills, *** weight change, *** energy, *** fatigue  HEENT: *** blurred vision, *** loss of vision, *** difficulty with swallowing food or tablets.  *** sore throat, *** hoarseness, *** tinnitus, *** hearing loss  Cardiovascular: *** chest pain, *** dyspnea on exertion, *** orthopnea or PND, *** palpitation and ***  edema, *** pain in legs walking  Pulmonary: *** coughing, *** wheezing, *** chest congestion  Skin: *** ulcers.  *** hair loss, *** change to skin, nails or athlete's foot, *** rash, *** itching  GI: *** reflux symptoms, *** nausea, *** vomiting, *** gastroparesis, *** constipation, *** diarrhea. *** appetite  Neurologic: *** paresthesia to the hands or feet and *** sensory loss of the hands or feet.  *** headache, *** dizziness, *** speech changes.  *** hypoglycemic unawareness  M/S: *** proximal muscle weakness.  *** back pain, *** neck pain, *** joint pain, *** muscle pain.  Lymphatic:  *** abnormal bleeding.  *** abnormal bruising   GU: *** polydipsia, *** polyuria, *** polyphagia.  *** nocturia.  *** painful urination, *** blood in urine, *** difficulty with urination  Psychiatric: *** depressed mood, *** anxiety.  *** difficulty with focus or concentration  Endocrine: *** hot/*** cold intolerance.  *** neck pain, *** pressure.      Tetracycline and Tetracycline hcl    Current Outpatient Medications:     glycopyrrolate (ROBINUL) 1 MG tablet, TAKE 1 TAB BY MOUTH IN THE MORNING, 1 TAB AT LUNCH, AND 1 TAB WITH DINNER, Disp: , Rfl:     Continuous Glucose Sensor (DEXCOM G6 SENSOR) MISC, Change every 10 days, Disp: 9 each, Rfl: 3    propylthiouracil (PTU)  50 MG tablet, TAKE 1 TABLET BY MOUTH EVERY DAY, Disp: 90 tablet, Rfl: 0    Insulin Disposable Pump (OMNIPOD 5 G6 PODS, GEN 5,) MISC, Change every 3 days, Disp: 10 each, Rfl: 3    lisinopril (PRINIVIL;ZESTRIL) 5 MG tablet, Take 1 tablet by mouth daily, Disp: , Rfl:     NOVOLOG 100 UNIT/ML injection vial, Use up to a total daily dose of 60 units qd via insulin pump, Disp: 60 mL, Rfl: 3    insulin lispro (HUMALOG) 100 UNIT/ML SOLN injection vial, Inject up to a total daily dose of 60 units qd as directed via insulin pump, Disp: 60 mL, Rfl: 2    Continuous Blood Gluc Transmit (DEXCOM G6 TRANSMITTER) MISC, DEVICE FOR CONTINUOUS GLUCOSE MONITORING CHANGE EVERY 3 MONTHS,  Disp: 1 each, Rfl: 3    Insulin Disposable Pump (OMNIPOD 5 G6 INTRO, GEN 5,) KIT, Use as directed, Disp: 1 kit, Rfl: 0    Insulin Glargine-yfgn (SEMGLEE, YFGN,) 100 UNIT/ML SOPN, INJECT UP TO 22 UNITS EVERY DAY, Disp: 45 mL, Rfl: 1    Insulin Pen Needle 31G X 5 MM MISC, Use up to 8 per day to inject insulin, Disp: 800 each, Rfl: 3    blood glucose test strips (ACCU-CHEK GUIDE) strip, Use to test blood glucose levels up to five times daily., Disp: 200 each, Rfl: 3    blood glucose test strips (ACCU-CHEK GUIDE) strip, Use 5-6 times daily to check blood sugars, Disp: 600 each, Rfl: 3  Past Medical History:   Diagnosis Date    Diabetes 1.5, managed as type 1 (HCC)     Fibromyalgia      Past Surgical History:   Procedure Laterality Date    OTHER SURGICAL HISTORY      mother states no history of surgery     Social History     Socioeconomic History    Marital status: Single     Spouse name: Not on file    Number of children: Not on file    Years of education: Not on file    Highest education level: Not on file   Occupational History    Not on file   Tobacco Use    Smoking status: Never    Smokeless tobacco: Current   Substance and Sexual Activity    Alcohol use: Not Currently    Drug use: Yes     Types: Marijuana Sheran Fava)    Sexual activity: Not on file   Other Topics Concern    Not on file   Social History Narrative    Not on file     Social Determinants of Health     Financial Resource Strain: Not on file   Food Insecurity: Not on file   Transportation Needs: Not on file   Physical Activity: Not on file   Stress: Not on file   Social Connections: Not on file   Intimate Partner Violence: Not on file   Housing Stability: Not on file     Family History   Problem Relation Age of Onset    Atrial Fibrillation Mother     Other Father      Component      Latest Ref Rng 12/13/2022 04/06/2023   WBC      4.0 - 10.0 K/uL  5.8    RBC      3.93 - 5.22 M/uL  3.25 (L)    Hemoglobin Quant      11.8 - 14.8 g/dL  9.9 (  L)    Hematocrit       36.0 - 45.0 %  29.3 (L)    MCV      80.0 - 95.0 FL  90.2    MCH      25.6 - 32.2 PG  30.5    MCHC      32.2 - 35.5 g/dL  30.8    RDW      65.7 - 14.4 %  12.4    Platelet Count      150 - 400 K/uL  209    MPV      9.4 - 12.4 FL  10.0    Nucleated Red Blood Cells      K/uL  0.00    Nucleated Red Blood Cells      0 - 0.02 PER 100 WBC  0.0    Seg Neutrophils      %  50    Lymphocytes      %  38    Monocytes %      %  8    Eosinophils %      %  3    Basophils %      %  1    Immature Granulocytes %      %  0    Neutrophils Absolute      1.6 - 6.1 K/UL  2.9    Lymphocytes Absolute      1.2 - 3.7 K/UL  2.2    Monocytes Absolute      0.2 - 0.8 K/UL  0.5    Eosinophils Absolute      0.0 - 0.5 K/UL  0.2    Basophils Absolute      0.0 - 0.1 K/UL  0.0    Immature Granulocytes Absolute      0.00 - 0.03 K/UL  0.0    Differential Type  AUTOMATED    Sodium      136 - 145 mmol/L  136    Potassium      3.5 - 5.1 mmol/L  4.2    Chloride      98 - 107 mmol/L  103    CARBON DIOXIDE      22 - 29 mmol/L  24    Anion Gap      5 - 15 mmol/L  9    Glucose      74 - 109 mg/dL  846    BUN,BUNPL      6 - 20 MG/DL  31 (H)    Creatinine      0.51 - 0.95 MG/DL  9.62 (H)    Bun/Cre      12 - 20   18    Est, Glom Filt Rate      >60 ml/min/1.29m2  40 (L)    Calcium      8.6 - 10.0 MG/DL  8.8    Total Bilirubin      0 - 1.20 mg/dL  9.52    ALT      0 - 35 U/L  25    AST      0 - 35 U/L  29    Alkaline Phosphatase      35 - 104 U/L  60    Total Protein      6.4 - 8.3 g/dL  7.0    Albumin      3.5 - 5.2 g/dL  4.4    Albumin/Globulin Ratio  1.0 - 3.0    1.7    Cholesterol, Total      <200 MG/DL 469     Triglycerides      0 - 150 MG/DL 76     HDL Cholesterol      >50 MG/DL 44 (L)     LDL Cholesterol      <100 MG/DL 629.5 (H)     Chol/HDL Ratio 3.8     Non-HDL Cholesterol      <130 mg/dL 284     Microalb, Ur      0 - 20.00 mg/L 138.00 (H)     Creatinine, Ur      29 - 226 mg/dL 13.24     Microalb/Creat Ratio POC      <30 mg/g 210 (H)     Hemoglobin A1C       4.0 - 6.0 % 6.2 (H)  5.8    eAG (mg/dL)      mg/dL 401  027    TSH, 3rd Generation      0.270 - 4.200 uIU/mL <0.005 (L)  0.080 (L)    Triiodothyronine, Free, Serum      2.00 - 4.40 pg/mL 4.77 (H)  2.48    T4 Free      0.9 - 1.7 ng/dL 2.1 (H)  1.2        PHYSICAL EXAM  There were no vitals filed for this visit.  General appearance: alert, well appearing, and in no distress, weight: Overweight  Mental status: alert, oriented to person, place, and time, normal mood, behavior, speech, dress, motor activity, and thought processes  Neck: Supple, no palpable cervical lymph nodes  Thyroid: normal size, non-tender.  No goiter or nodules palpable.    CV: Regular rate and rhythm.  No murmur, rub or gallop  Resp: Clear to ausculation without wheezing, rales or rhonchi  Neuro: CN II-XII intact, no focal weakness.  Gait normal.  DTR normal.  Skin: Normal temperature and texture.   Lipodystrophy ***, skin breakdown ***.  There are *** abnormal callus formations on the feet.  There are *** ulcerations or skin lesions present.  The interdigital web spaces free of tenia  Abdomen: soft, NTND + BS x4.  Extremities: Good tone.  *** deformities, *** cyanosis, *** edema noted.  *** Charcot foot  HEENT: PERRLA, Oropharynx benign, mucus membranes pink and moist.  Good dentition   Stare: none  Lid Lag: none  EOMI: intact  Fundi: not assessed       Diabetic foot exam:   Left Foot:              Visual Exam: ***              Pulse DP: 2+ (normal)              Filament test: *** sensation              Vibratory Sensation: ***  Right Foot:              Visual Exam: ***              Pulse DP: 2+ (normal)              Filament test: *** sensation              Vibratory Sensation: ***    ASSESSMENT AND PLAN  Diabetes type 1 with nephropathy:  Hypertension:  Hyperlipidemia:  Hyperthyroid     Follow up in the office in  3 months or sooner if any issues arise   Data that has been cut and pasted has been obtained from Epic Chart, Care Everywhere and  Media  Visit time  {TIME; ACP time 16 min - 1 hour:28416::"<16 minutes (Non-Billable)"}    This document was generated using the aid of voice recognition software. Please be aware that there may be inadvertent transcription errors not identified and corrected by the author.

## 2023-06-10 ENCOUNTER — Telehealth

## 2023-06-10 MED ORDER — FIASP 100 UNIT/ML IJ SOLN
100 | INTRAMUSCULAR | 1 refills | Status: DC
Start: 2023-06-10 — End: 2024-02-20

## 2023-06-10 MED ORDER — OMNIPOD 5 DEXG7G6 PODS GEN 5 MISC
3 refills | Status: DC
Start: 2023-06-10 — End: 2023-10-06

## 2023-06-10 MED ORDER — INSULIN LISPRO 100 UNIT/ML IJ SOLN
100 | INTRAMUSCULAR | 2 refills | Status: DC
Start: 2023-06-10 — End: 2023-06-10

## 2023-06-10 NOTE — Telephone Encounter (Signed)
On call note  Calls for refills  Message left that Humalog may not be covered. Can switch to covered option or use lilly $35 co-pay card    Orders Placed This Encounter    Insulin Disposable Pump (OMNIPOD 5 G6 PODS, GEN 5,) MISC     Sig: Change every 3 days     Dispense:  10 each     Refill:  3    insulin lispro (HUMALOG,ADMELOG) 100 UNIT/ML SOLN injection vial     Sig: Inject up to a total daily dose of 60 units qd as directed via insulin pump     Dispense:  60 mL     Refill:  2

## 2023-06-10 NOTE — Telephone Encounter (Signed)
Akshadha Yanik let a message that humalg is not covered. Insurance appears to cover fiastp  Rx sent to pharmacy  Orders Placed This Encounter    Insulin Aspart, w/Niacinamide, (FIASP) 100 UNIT/ML SOLN     Sig: Inject up to a total daily dose of 65 units qd as directed via insulin pump     Dispense:  60 mL     Refill:  1

## 2023-07-03 ENCOUNTER — Telehealth

## 2023-07-03 MED ORDER — INSULIN LISPRO 100 UNIT/ML IJ SOLN
100 | INTRAMUSCULAR | 1 refills | Status: AC
Start: 2023-07-03 — End: ?

## 2023-07-03 NOTE — Telephone Encounter (Signed)
Patient called she stated her medication (Insulin) is backordered.    (They can't find this medication anywhere.)    Patient wants to know if Dr. Helmut Muster can call something else in  to her pharmacy that her insurance will cover.     Return call: [phone]

## 2023-07-03 NOTE — Telephone Encounter (Signed)
Patients Kathryn [name] is on back order. Patient requesting alternative medication

## 2023-07-03 NOTE — Telephone Encounter (Signed)
Orders Placed This Encounter    insulin lispro (HUMALOG,ADMELOG) 100 UNIT/ML SOLN injection vial     Sig: Use up to 65 units qd via insulin pump     Dispense:  60 mL     Refill:  1

## 2023-07-05 ENCOUNTER — Encounter

## 2023-07-05 ENCOUNTER — Inpatient Hospital Stay: Payer: PRIVATE HEALTH INSURANCE | Primary: Internal Medicine

## 2023-07-05 DIAGNOSIS — E1043 Type 1 diabetes mellitus with diabetic autonomic (poly)neuropathy: Secondary | ICD-10-CM

## 2023-07-06 ENCOUNTER — Ambulatory Visit
Admit: 2023-07-06 | Discharge: 2023-07-06 | Payer: PRIVATE HEALTH INSURANCE | Attending: Internal Medicine | Primary: Internal Medicine

## 2023-07-06 VITALS — BP 140/82 | HR 84 | Ht 68.0 in

## 2023-07-06 DIAGNOSIS — E1065 Type 1 diabetes mellitus with hyperglycemia: Secondary | ICD-10-CM

## 2023-07-06 LAB — AMB POC HEMOGLOBIN A1C: Hemoglobin A1C, POC: 6 %

## 2023-07-06 NOTE — Patient Instructions (Addendum)
.   Finerenone -discuss with nephrolgist

## 2023-07-06 NOTE — Assessment & Plan Note (Addendum)
 Clinically euthyroid.  Remains clinically euthyroid.  Initiated therapy with methimazole 06/10/2022.  Transitioned to PTU for 2024.  Last TSH minimally minimally suppressed.  No evidence of thyroid eye disease   Plan:  -TSH free T4 free T3   -hepatic prof

## 2023-07-06 NOTE — Progress Notes (Signed)
 32 y.o.y.o. female here to establish  care for of type 1  diabetes.    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

## 2023-07-06 NOTE — Assessment & Plan Note (Addendum)
 On Omnipod 5 insulin pump with smart adjust technology. Glycemic control is suboptimal. Hga1c = 6.0%. Average blood sugars is 163 . Time in target range is 70.%(goal > 70%). Hypoglycemia is rare with 0 % of blood sugars < 70(level 1 hypoglycemia) and 0 %

## 2023-07-09 NOTE — Assessment & Plan Note (Signed)
 Urine microalbumin creatinine ratio remains elevated.  Diminished GFR of 40   Plan:   -maintain blood pressure less than 130/80 (forgot lisinopril for the past 2 days)   -continue lisinopril   -consider for finereonone- will defer to Nephrology  - hold on

## 2023-07-09 NOTE — Assessment & Plan Note (Signed)
 GFR = 40. + microalbumin.   PLAN:  - continue lisinopril   -blood pressure above target today) forgot to take lisinopril for the past 2 days)   -consider finerenone will defer to Nephrology   -will hold on S gop 2 inhibitor therapy due to increased risk o

## 2023-07-09 NOTE — Assessment & Plan Note (Signed)
 Last lipid profile on 12/13/2022. LDL above target range Triglycerides in target range. Kathryn [name] prefers to hold on statin therapy.  Will rediscuss at future visits   Lab Results   Component Value Date    CHOL 165 12/13/2022    TRIG 76 12/13/2022    HDL 44

## 2023-08-01 ENCOUNTER — Encounter: Admit: 2023-08-01 | Admitting: Internal Medicine

## 2023-08-01 DIAGNOSIS — E1043 Type 1 diabetes mellitus with diabetic autonomic (poly)neuropathy: Secondary | ICD-10-CM

## 2023-08-02 MED ORDER — DEXCOM G6 TRANSMITTER MISC
3 refills | 30.00000 days | Status: DC
Start: 2023-08-02 — End: 2024-09-16

## 2023-08-02 NOTE — Telephone Encounter (Signed)
 Medications Requested:  Requested Prescriptions     Pending Prescriptions Disp Refills    Continuous Glucose Transmitter (DEXCOM G6 TRANSMITTER) MISC [Pharmacy Med Name: DEXCOM G6 TRANSMITTER]  3     Sig: DEVICE FOR CONTINUOUS GLUCOSE MONITORING CHANGE E

## 2023-10-05 ENCOUNTER — Encounter

## 2023-10-05 NOTE — Telephone Encounter (Signed)
 No protocol to follow  Next appt 11/07/2023    Medications Requested:  Requested Prescriptions     Pending Prescriptions Disp Refills    propylthiouracil (PTU) 50 MG tablet [Pharmacy Med Name: PROPYLTHIOURACIL 50 MG TABLET] 90 tablet 1     Sig: TAKE 1 TABLET BY MOUTH EVERY DAY       Preferred Pharmacy:   [facility] [address] [place] [facility] [address] [place] - P [phone] Carmon Ginsberg [phone]  [address] [place]  [place] [address]  Phone: [phone] Fax: [phone]          Prescription Refill Protocol reviewed: Yes    Allergy List Reviewed and Verified: Yes    Possible medication to medication interactions reviewed: Yes    Last appt @ Office: 07/06/2023     Future Appointments   Date Time Provider Department Center   11/07/2023  8:00 AM Malcolm Metro, APRN - NP [facility]   03/04/2024 11:30 AM Bobbye Riggs, MD [facility]       MOST RECENT BLOOD PRESSURES  BP Readings from Last 3 Encounters:   07/06/23 (!) 140/82   04/07/23 112/70   12/13/22 128/64         MOST RECENT LAB DATA  Lab Results   Component Value Date/Time    K 4.2 04/06/2023 07:18 AM    ALT 25 04/06/2023 07:18 AM    TSH 0.080 04/06/2023 07:18 AM    CHOL 165 12/13/2022 07:29 AM    HGB 9.9 04/06/2023 07:18 AM    HCT 29.3 04/06/2023 07:18 AM    HBA1CPOC 6.0 07/06/2023 03:12 PM

## 2023-10-06 ENCOUNTER — Encounter

## 2023-10-06 MED ORDER — OMNIPOD 5 DEXG7G6 PODS GEN 5 MISC
3 refills | 30.00000 days | Status: DC
Start: 2023-10-06 — End: 2024-09-10

## 2023-10-06 MED ORDER — PROPYLTHIOURACIL 50 MG PO TABS
50 | ORAL_TABLET | Freq: Every day | ORAL | 1 refills | Status: DC
Start: 2023-10-06 — End: 2024-08-29

## 2023-10-06 NOTE — Telephone Encounter (Signed)
 No protocol to follow  Next appt 11/07/23    Medications Requested:  Requested Prescriptions     Pending Prescriptions Disp Refills    Insulin Disposable Pump (OMNIPOD 5 DEXG7G6 PODS GEN 5) MISC [Pharmacy Med Name: OMNIPOD 5 DEXG7G6 PODS (GEN 5)]  3     Sig: CHANGE EVERY 3 DAYS       Preferred Pharmacy:   [facility] [address] [place] - [address] [place] - P [phone] Carmon Ginsberg [phone]  [address] [place]  [place] [address]  Phone: [phone] Fax: [phone]          Prescription Refill Protocol reviewed: Yes    Allergy List Reviewed and Verified: Yes    Possible medication to medication interactions reviewed: Yes    Last appt @  Office: 07/06/2023     Future Appointments   Date Time Provider Department Center   11/07/2023  8:00 AM Malcolm Metro, APRN - NP [facility]   03/04/2024 11:30 AM Bobbye Riggs, MD [facility]       MOST RECENT BLOOD PRESSURES  BP Readings from Last 3 Encounters:   07/06/23 (!) 140/82   04/07/23 112/70   12/13/22 128/64         MOST RECENT LAB DATA  Lab Results   Component Value Date/Time    K 4.2 04/06/2023 07:18 AM    ALT 25 04/06/2023 07:18 AM    TSH 0.080 04/06/2023 07:18 AM    CHOL 165 12/13/2022 07:29 AM    HGB 9.9 04/06/2023 07:18 AM    HCT 29.3 04/06/2023 07:18 AM    HBA1CPOC 6.0 07/06/2023 03:12 PM

## 2023-11-06 NOTE — Patient Instructions (Incomplete)
 Things to do after your visit today  Medications:   ***    Check blood sugars: Dexcom     Send glucose logs in: as needed     [facility]: Please have labs drawn THIS WEEK.  Fasting is not required  Please try to have your labs drawn 3-5 days prior to your next appointment.  Fasting is required    Follow-up appointment will be in 4 months    To schedule or change an appointment, for any questions, or to leave a message please call [phone]    If you have not set up the patient portal please consider doing so.    Web sites  [email or web address]  [email or web address]  [email or web address]  [email or web address]    Glucose monitoring information    I strongly encourage you to keep a glucose log book.  Record the date, time of test, results, medication dosing and any comments related to what you think may have affected your blood sugar level.  This includes exercise, illness, stress, restaurant foods, snacks, etc.    If you utilize a continuous glucose monitor and/or insulin pump, please try to download it every 6-8 weeks if it is not already connected to the practice online. If you would like your glucose levels to be reviewed due to a period of illness or your glucose levels are continuously out of the normal range despite taking your medication, watching your diet, and exercise, please download the information and let us know you have down so.    Hypoglycemia (low glucose level)  Are levels below 70. You may feel symptoms of sweating, shakiness, nervousness, pounding heart, weakness, confusion, lightheadedness, grouchy or sleepiness. If you feel these symptoms YOU MUST CHECK YOUR BLOOD SUGAR.   Causes of hypoglycemia including: skipped or delayed meal, increased physical activity or exercise, too much or incorrect type of diabetic medication.     If blood glucose is below 70 or any symptoms are felt do ONE of the following:   4 glucose tablets or 1 tube of glucose gel    1 cup milk  4 oz. Juice (Do not take orange  juice if you have kidney failure)    4 oz. can regular (non-diet) soda.    Wait 15 minutes. Recheck blood sugar. If still below 70, repeat treatment. When feeling better, eat 1/2 sandwich or drink 1 cup milk OR if it is less than one hour before mealtime, eat the meal. Call physician if still not feeling better after 30 minutes or if blood sugar stays below 70    Please remember the office policy for prescription refills is 3 business days    Please be aware it may take 1-2 weeks for labs results to be mailed.     If you are on any injectable medications:

## 2023-11-07 ENCOUNTER — Ambulatory Visit: Payer: PRIVATE HEALTH INSURANCE | Primary: Internal Medicine

## 2023-11-07 NOTE — Progress Notes (Unsigned)
 History of present illness: Kathryn [name] is a 33 y.o. female patient with diabetes and hyperthyroidism who presents for follow-up visit      She has a history of type 1 diabetes mellitus initially diagnosed at age 12.  At that time she presented to the emergency department with diabetic ketoacidosis.  She does have a noted history of withholding insulin therapy to help control her weight.  She also has a history suppressed TSH levels due to Graves disease.  Patient underwent a thyroid uptake and scan in late December 2023 with results suggestive of Graves disease.  Prior to the scan she had been on methimazole therapy, however self discontinued the medication and January 2024 due to diffuse cramping she was experiencing.    Change in health status since last office visit: ***    Overall reports feeling: ***    Diabetes type: TYPE 1    Hypoglycemia: ***           Safety: Medical ID: no.  Carries Emergency CHO: yes  Glucagon: no    Education: During visit    Medication:  PTU 50 mg daily              Lisinopril 5 mg daily              Humalog via Omnipod 5   ***    Previous Medications Used and Reason for Discontinuation:   Methimazole: Patient reported diffuse cramping, self discontinued      Glucose review:  Dexcom/Omnipod  ***  ***    Barriers to good control identified and discussed including:  Diabetes knowledge: Fair  Exercise: ***  Diet  Breakfast: ***  Lunch: ***  Supper: ***  Other factors: ***    Complications  Retinopathy: No documentation of eye exam  Nephropathy: yes, CKD  Neuropathy: no  Cardiovascular: no  CVA: no  Autonomic dysfunction: no    Review of Systems  Constitutional:  *** fevers, *** chills, *** weight change, *** energy, *** fatigue  HEENT: *** blurred vision, *** loss of vision, *** difficulty with swallowing food or tablets.  *** sore throat, *** hoarseness, *** tinnitus, *** hearing loss  Cardiovascular: *** chest pain, *** dyspnea on exertion, *** orthopnea or PND, *** palpitation and  *** edema, *** pain in legs walking  Pulmonary: *** coughing, *** wheezing, *** chest congestion  Skin: *** ulcers.  *** hair loss, *** change to skin, nails or athlete's foot, *** rash, *** itching  GI: *** reflux symptoms, *** nausea, *** vomiting, *** gastroparesis, *** constipation, *** diarrhea. *** appetite  Neurologic: *** paresthesia to the hands or feet and *** sensory loss of the hands or feet.  *** headache, *** dizziness, *** speech changes.  *** hypoglycemic unawareness  M/S: *** proximal muscle weakness.  *** back pain, *** neck pain, *** joint pain, *** muscle pain.  Lymphatic:  *** abnormal bleeding.  *** abnormal bruising   GU: *** polydipsia, *** polyuria, *** polyphagia.  *** nocturia.  *** painful urination, *** blood in urine, *** difficulty with urination  Psychiatric: *** depressed mood, *** anxiety.  *** difficulty with focus or concentration  Endocrine: *** hot/*** cold intolerance.  *** neck pain, *** pressure.      Tetracycline and Tetracycline hcl    Current Outpatient Medications:     propylthiouracil (PTU) 50 MG tablet, TAKE 1 TABLET BY MOUTH EVERY DAY, Disp: 90 tablet, Rfl: 1    Insulin Disposable Pump (OMNIPOD 5 DEXG7G6 PODS GEN 5) MISC, CHANGE  EVERY 3 DAYS, Disp: 30 each, Rfl: 3    Continuous Glucose Transmitter (DEXCOM G6 TRANSMITTER) MISC, DEVICE FOR CONTINUOUS GLUCOSE MONITORING CHANGE EVERY 3 MONTHS, Disp: 1 each, Rfl: 3    insulin lispro (HUMALOG,ADMELOG) 100 UNIT/ML SOLN injection vial, Use up to 65 units qd via insulin pump, Disp: 60 mL, Rfl: 1    Insulin Aspart, w/Niacinamide, (FIASP) 100 UNIT/ML SOLN, Inject up to a total daily dose of 65 units qd as directed via insulin pump, Disp: 60 mL, Rfl: 1    glycopyrrolate (ROBINUL) 1 MG tablet, TAKE 1 TAB BY MOUTH IN THE MORNING, 1 TAB AT LUNCH, AND 1 TAB WITH DINNER, Disp: , Rfl:     Continuous Glucose Sensor (DEXCOM G6 SENSOR) MISC, Change every 10 days, Disp: 9 each, Rfl: 3    lisinopril (PRINIVIL;ZESTRIL) 5 MG tablet, Take 1  tablet by mouth daily, Disp: , Rfl:     NOVOLOG 100 UNIT/ML injection vial, Use up to a total daily dose of 60 units qd via insulin pump, Disp: 60 mL, Rfl: 3    Insulin Disposable Pump (OMNIPOD 5 G6 INTRO, GEN 5,) KIT, Use as directed, Disp: 1 kit, Rfl: 0    Insulin Glargine-yfgn (SEMGLEE, YFGN,) 100 UNIT/ML SOPN, INJECT UP TO 22 UNITS EVERY DAY, Disp: 45 mL, Rfl: 1    Insulin Pen Needle 31G X 5 MM MISC, Use up to 8 per day to inject insulin, Disp: 800 each, Rfl: 3    blood glucose test strips (ACCU-CHEK GUIDE) strip, Use to test blood glucose levels up to five times daily., Disp: 200 each, Rfl: 3    blood glucose test strips (ACCU-CHEK GUIDE) strip, Use 5-6 times daily to check blood sugars, Disp: 600 each, Rfl: 3  Past Medical History:   Diagnosis Date    Diabetes 1.5, managed as type 1 (HCC)     Fibromyalgia      Past Surgical History:   Procedure Laterality Date    OTHER SURGICAL HISTORY      mother states no history of surgery     Social History     Socioeconomic History    Marital status: Single     Spouse name: Not on file    Number of children: Not on file    Years of education: Not on file    Highest education level: Not on file   Occupational History    Not on file   Tobacco Use    Smoking status: Never    Smokeless tobacco: Never   Vaping Use    Vaping status: Never Used   Substance and Sexual Activity    Alcohol use: Not Currently    Drug use: Yes     Types: Marijuana Sheran Fava)    Sexual activity: Not on file   Other Topics Concern    Not on file   Social History Narrative    Not on file     Social Drivers of Health     Financial Resource Strain: Not on file   Food Insecurity: Not on file   Transportation Needs: Not on file   Physical Activity: Not on file   Stress: Not on file   Social Connections: Not on file   Intimate Partner Violence: Not on file   Housing Stability: Not on file     Family History   Problem Relation Age of Onset    Atrial Fibrillation Mother     Other Father      Component  Latest  Ref Rng 12/13/2022 04/06/2023 07/06/2023   WBC      4.0 - 10.0 K/uL  5.8     RBC      3.93 - 5.22 M/uL  3.25 (L)     Hemoglobin Quant      11.8 - 14.8 g/dL  9.9 (L)     Hematocrit      36.0 - 45.0 %  29.3 (L)     MCV      80.0 - 95.0 FL  90.2     MCH      25.6 - 32.2 PG  30.5     MCHC      32.2 - 35.5 g/dL  29.5     RDW      62.1 - 14.4 %  12.4     Platelet Count      150 - 400 K/uL  209     MPV      9.4 - 12.4 FL  10.0     Nucleated Red Blood Cells      K/uL  0.00     Nucleated Red Blood Cells      0 - 0.02 PER 100 WBC  0.0     Seg Neutrophils      %  50     Lymphocytes      %  38     Monocytes %      %  8     Eosinophils %      %  3     Basophils %      %  1     Immature Granulocytes %      %  0     Neutrophils Absolute      1.6 - 6.1 K/UL  2.9     Lymphocytes Absolute      1.2 - 3.7 K/UL  2.2     Monocytes Absolute      0.2 - 0.8 K/UL  0.5     Eosinophils Absolute      0.0 - 0.5 K/UL  0.2     Basophils Absolute      0.0 - 0.1 K/UL  0.0     Immature Granulocytes Absolute      0.00 - 0.03 K/UL  0.0     Differential Type  AUTOMATED     Sodium      136 - 145 mmol/L  136     Potassium      3.5 - 5.1 mmol/L  4.2     Chloride      98 - 107 mmol/L  103     CARBON DIOXIDE      22 - 29 mmol/L  24     Anion Gap      5 - 15 mmol/L  9     Glucose      74 - 109 mg/dL  308     BUN,BUNPL      6 - 20 MG/DL  31 (H)     Creatinine      0.51 - 0.95 MG/DL  6.57 (H)     Bun/Cre      12 - 20   18     Est, Glom Filt Rate      >60 ml/min/1.30m2  40 (L)     Calcium      8.6 - 10.0 MG/DL  8.8     Total Bilirubin      0 - 1.20 mg/dL  0.32     ALT      0 - 35 U/L  25     AST      0 - 35 U/L  29     Alkaline Phosphatase      35 - 104 U/L  60     Total Protein      6.4 - 8.3 g/dL  7.0     Albumin      3.5 - 5.2 g/dL  4.4     Albumin/Globulin Ratio      1.0 - 3.0    1.7     Cholesterol, Total      <200 MG/DL 161      Triglycerides      0 - 150 MG/DL 76      HDL Cholesterol      >50 MG/DL 44 (L)      LDL Cholesterol      <100 MG/DL 096.0 (H)       Chol/HDL Ratio 3.8      Non-HDL Cholesterol      <130 mg/dL 454      Microalb, Ur      0 - 20.00 mg/L 138.00 (H)      Creatinine, Ur      29 - 226 mg/dL 09.81      Albumin/Creatinine Ratio      <30 mg/g 210 (H)      Hemoglobin A1C      4.0 - 6.0 % 6.2 (H)  5.8     eAG (mg/dL)      mg/dL 191  478     TSH, 3rd Generation      0.270 - 4.200 uIU/mL <0.005 (L)  0.080 (L)     Triiodothyronine, Free, Serum      2.00 - 4.40 pg/mL 4.77 (H)  2.48     T4 Free      0.9 - 1.7 ng/dL 2.1 (H)  1.2     Hemoglobin A1C, POC      %   6.0        PHYSICAL EXAM  There were no vitals filed for this visit.  General appearance: alert, well appearing, and in no distress, weight: Overweight  Mental status: alert, oriented to person, place, and time, normal mood, behavior, speech, dress, motor activity, and thought processes  Neck: Supple, no palpable cervical lymph nodes  Thyroid: normal size, non-tender.  No goiter or nodules palpable.    CV: Regular rate and rhythm.  No murmur, rub or gallop  Resp: Clear to ausculation without wheezing, rales or rhonchi  Neuro: CN II-XII intact, no focal weakness.  Gait normal.  DTR normal.  Skin: Normal temperature and texture.   Lipodystrophy ***, skin breakdown ***.  There are *** abnormal callus formations on the feet.  There are *** ulcerations or skin lesions present.  The interdigital web spaces free of tenia  Abdomen: soft, NTND + BS x4.  Extremities: Good tone.  *** deformities, *** cyanosis, *** edema noted.  *** Charcot foot  HEENT: PERRLA, Oropharynx benign, mucus membranes pink and moist.  Good dentition   Stare: none  Lid Lag: none  EOMI: intact  Fundi: not assessed       Diabetic foot exam:   Left Foot:              Visual Exam: ***              Pulse DP: 2+ (normal)  Filament test: *** sensation              Vibratory Sensation: ***  Right Foot:              Visual Exam: ***              Pulse DP: 2+ (normal)              Filament test: *** sensation              Vibratory  Sensation: ***    ASSESSMENT AND PLAN  Diabetes type 1 with nephropathy, neuropathy:  Hypertension:  Hyperlipidemia:  Hyperthyroid:      Follow up in the office in 4 months or sooner if any issues arise   Data that has been cut and pasted has been obtained from Epic Chart, Care Everywhere and Media  Visit time  {TIME; ACP time 16 min - 1 hour:28416::"<16 minutes (Non-Billable)"}    This document was generated using the aid of voice recognition software. Please be aware that there may be inadvertent transcription errors not identified and corrected by the author.

## 2024-02-19 ENCOUNTER — Encounter

## 2024-02-20 MED ORDER — FIASP 100 UNIT/ML IJ SOLN
100 | INTRAMUSCULAR | 1 refills | 71.00000 days | Status: DC
Start: 2024-02-20 — End: 2024-08-29

## 2024-02-20 NOTE — Telephone Encounter (Signed)
 Insulin  Protocol Failed06/30/2025 12:55 AM   Protocol Details Last HgbA1c resulted within the past 6 months    Last creatinine level resulted within the past 12 months    Visit with authorizing provider in past 6 months or upcoming 90 days     Last A1c 07/06/2023   6.0    Medications Requested:  Requested Prescriptions     Pending Prescriptions Disp Refills    Insulin  Aspart, w/Niacinamide, (FIASP ) 100 UNIT/ML SOLN [Pharmacy Med Name: FIASP  100 UNIT/ML VIAL] 60 mL 1     Sig: INJECT UP TO A TOTAL DAILY DOSE OF 65 UNITS DAILY AS DIRECTED VIA INSULIN  PUMP       Preferred Pharmacy:   [facility] [address] [place] [facility] [address] [place] - P [phone] GLENWOOD FALCON [phone]  [address] [place]  [place] [address]  Phone: [phone] Fax: [phone]      Date of Last Refill: 06/10/2023    Prescription Refill Protocol reviewed: Yes    Allergy List Reviewed and Verified: Yes    Possible medication to medication interactions reviewed: Yes    Last appt @ PCP Office: 07/06/2023     Future Appointments   Date Time Provider Department Center   03/04/2024 11:30 AM Darrel Rollene BROCKS, MD [facility]       MOST RECENT BLOOD PRESSURES  BP Readings from Last 3 Encounters:   07/06/23 (!) 140/82   04/07/23 112/70   12/13/22 128/64         MOST RECENT LAB DATA  Lab Results   Component Value Date/Time    K 4.2 04/06/2023 07:18 AM    ALT 25 04/06/2023 07:18 AM    TSH 0.080 04/06/2023 07:18 AM    CHOL 165 12/13/2022 07:29 AM    HGB 9.9 04/06/2023 07:18 AM    HCT 29.3 04/06/2023 07:18 AM    HBA1CPOC 6.0 07/06/2023 03:12 PM

## 2024-03-01 ENCOUNTER — Inpatient Hospital Stay: Admit: 2024-03-01 | Payer: PRIVATE HEALTH INSURANCE | Primary: Internal Medicine

## 2024-03-01 DIAGNOSIS — E0500 Thyrotoxicosis with diffuse goiter without thyrotoxic crisis or storm: Principal | ICD-10-CM

## 2024-03-01 LAB — URINALYSIS WITH REFLEX TO CULTURE
BACTERIA, URINE: NEGATIVE /HPF
Bilirubin, Urine: NEGATIVE
Blood, Urine: NEGATIVE
Glucose, Ur: NORMAL mg/dL
Ketones, Urine: NEGATIVE mg/dL
Leukocyte Esterase, Urine: NEGATIVE uL
Nitrite, Urine: NEGATIVE
Protein, Urine: NEGATIVE mg/dL
Specific Gravity, UA: 1.011 (ref 1.008–1.030)
Urobilinogen, Urine: NORMAL EU/dL
pH, Urine: 6 (ref 5.0–8.0)

## 2024-03-01 LAB — COMPREHENSIVE METABOLIC PANEL
ALT: 14 U/L (ref 0–35)
AST: 20 U/L (ref 0–35)
Albumin/Globulin Ratio: 1.5 (ref 1.0–3.0)
Albumin: 4 g/dL (ref 3.5–5.2)
Alk Phosphatase: 60 U/L (ref 35–104)
Anion Gap: 10 mmol/L (ref 5–15)
BUN/Creatinine Ratio: 25 — ABNORMAL HIGH (ref 12–20)
BUN: 44 mg/dL — ABNORMAL HIGH (ref 6–20)
CO2: 23 mmol/L (ref 22–29)
Calcium: 8.7 mg/dL (ref 8.6–10.0)
Chloride: 105 mmol/L (ref 98–107)
Creatinine: 1.74 mg/dL — ABNORMAL HIGH (ref 0.51–0.95)
Est, Glom Filt Rate: 39 ml/min/1.73m2 — ABNORMAL LOW (ref >60)
Glucose: 122 mg/dL — ABNORMAL HIGH (ref 70–99)
Potassium: 4.7 mmol/L (ref 3.5–5.1)
Sodium: 138 mmol/L (ref 136–145)
Total Bilirubin: 0.25 mg/dL (ref 0–1.20)
Total Protein: 6.6 g/dL (ref 6.4–8.3)

## 2024-03-01 LAB — LIPID PANEL
Chol/HDL Ratio: 2.6
Cholesterol, Total: 177 mg/dL (ref <200)
HDL: 68 mg/dL (ref >50)
LDL Cholesterol: 99.2 mg/dL (ref <100)
Non-HDL Cholesterol: 109 mg/dL (ref <130)
Triglycerides: 49 mg/dL (ref 0–150)

## 2024-03-01 LAB — PROLACTIN: Prolactin: 15.7 ng/mL (ref 4.8–23.3)

## 2024-03-01 LAB — FOLLICLE STIMULATING HORMONE: FSH: 2.9 m[IU]/mL

## 2024-03-01 LAB — T4, FREE: T4 Free: 1.2 ng/dL (ref 0.9–1.7)

## 2024-03-01 LAB — TSH: TSH: 0.394 u[IU]/mL (ref 0.270–4.200)

## 2024-03-01 LAB — LUTEINIZING HORMONE: LH: 2.5 m[IU]/mL

## 2024-03-01 LAB — T3, FREE: T3, Free: 2.06 pg/mL (ref 2.00–4.40)

## 2024-03-04 ENCOUNTER — Ambulatory Visit
Admit: 2024-03-04 | Discharge: 2024-03-04 | Payer: PRIVATE HEALTH INSURANCE | Attending: Internal Medicine | Primary: Internal Medicine

## 2024-03-04 VITALS — BP 118/64 | HR 80

## 2024-03-04 DIAGNOSIS — E1021 Type 1 diabetes mellitus with diabetic nephropathy: Principal | ICD-10-CM

## 2024-03-04 LAB — ESTRADIOL: Estradiol, Mass Spec: 31 pg/mL

## 2024-03-04 LAB — AMB POC HEMOGLOBIN A1C: Hemoglobin A1C, POC: 6.6 %

## 2024-03-04 MED ORDER — ROSUVASTATIN CALCIUM 5 MG PO TABS
5 | ORAL_TABLET | Freq: Every day | ORAL | 1 refills | 90.00000 days | Status: DC
Start: 2024-03-04 — End: 2024-08-29

## 2024-03-04 MED ORDER — FINERENONE 10 MG PO TABS
10 | ORAL_TABLET | Freq: Every day | ORAL | 2 refills | 30.00000 days | Status: DC
Start: 2024-03-04 — End: 2024-08-29

## 2024-03-04 NOTE — Assessment & Plan Note (Addendum)
 Will screen for pancreatic exocrine insufficiency

## 2024-03-04 NOTE — Assessment & Plan Note (Signed)
 On Omnipod 5 insulin  pump with smart adjust technology. Glycemic control is good. Hga1c = 6.6%.Average blood sugars is 152 . Time in target range is 79.%(goal > 70%). Hypoglycemia is infrequent with 1 % of blood sugars < 70(level 1 hypoglycemia) and 0 % < 54(level 2 hypoglycemia).  Glucose variability/ coefficient of variation is normal range at 25.5%(goal < 36%) . Total basal insulin  via automated mode= 13.3 units/ 24 hours. Total basal insulin  manual mode = 13.2 units/ 24 hours. Patterns:blood sugars are stable while fasting indicating that basal insulin  as determined by omni pod 5 adaptive basal rates is appropriate. postprandial hyperglycemia suggestive of an insufficient I:CHO     PLAN:  - basal rate is managed by omnipod smart adjust technology and appears appropriate  - Insulin  to carbohydrate ratio appears to be insufficient  - Adjust active insulin  duration from 3 to 2 hours  - Change insulin -to-carb ratio to 1:9.5  - decrease correct above threshold to 110  - reviewed rotation of pod placement  -treatment and prevention of hypoglycemia reviewed  -reviewed need to have back up insulin  syringe in case of Pod failure  -

## 2024-03-04 NOTE — Progress Notes (Signed)
 33 y.o.y.o. female here to establish  care for of type 1  diabetes.    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 No longer withhold insulin  to control weight   Interim History :  Feels well  No acute medical problems  No menses for 12 + months. - nexiplon  Happy with OP5  Notes muscle cramps   Interpretation of RHF:aonni sugars reviewed via  [facility] for the past 2 weeks Average blood sugars is 152 . Time in target range is 79.%(goal > 70%). Hypoglycemia is infrequent with 1 % of blood sugars < 70(level 1 hypoglycemia) and 0 % < 54(level 2 hypoglycemia).  Glucose variability/ coefficient of variation is normal range at 25.5%(goal < 36%) . Total basal insulin  via automated mode= 13.3 units/ 24 hours. Total basal insulin  manual mode = 13.2 units/ 24 hours. Patterns:blood sugars are stable while fasting indicating that basal insulin  as determined by omni pod 5 adaptive basal rates is appropriate. postprandial hyperglycemia suggestive of an insufficient I:CHO              Diabetes related complications   Retinopathy: +    Nephropathy:? autominc neurpathy-+ gustatory hyperhidrosis   Neuropathy:+ numbness   Macrovascular:no   Thyroid screen: + graves disease   Celiac screen: --    Diabetic Medications       Insulin        Insulin  Aspart, w/Niacinamide, (FIASP ) 100 UNIT/ML SOLN INJECT UP TO A TOTAL DAILY DOSE OF 65 UNITS DAILY AS DIRECTED VIA INSULIN  PUMP     insulin  lispro (HUMALOG ,ADMELOG ) 100 UNIT/ML SOLN injection vial Use up to 65 units qd via insulin  pump     Insulin  Glargine-yfgn (SEMGLEE , YFGN,) 100 UNIT/ML SOPN INJECT UP TO 22 UNITS EVERY DAY           Lipid Lowering Medications       HMG CoA Reductase Inhibitors       rosuvastatin  (CRESTOR ) 5 MG tablet Take 1 tablet by mouth daily              Hypertension Medications       ACE Inhibitors       lisinopril (PRINIVIL;ZESTRIL) 5 MG tablet Take 1 tablet by mouth daily                INSULIN  SAFETY    Carbohydrate source to treat hypoglycemia glucagon Medical alert  Rotates injection sites                 ROS  Information updated on 7//14/2025   Chest pain No   SOB No   Claudication No   Neuropathy No   Change in visions No   Orthostatic symptoms not assessed  Tremors: mo  Palpitations:  No   Heat or cold intolerance: No energy level:  Okay  Signs of thyroid eye disease: No      Up to date with dental care not assessed      Dietary History:   Breakfast green monster smooth- kale/ banana/ almond butter   Lunch  : chicken./ sweet potoate   Dinner : salad/ chcien or fish/roasted vegatable   Exercise:not assessed     Patient Active Problem List   Diagnosis    Acute renal failure (ARF)    Internal derangement of knee  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)    Gustatory hyperhidrosis    Acne excoriee    AKI (acute kidney injury)    Anxiety    BMI between 19-24,adult    Diffuse goiter    Dysautonomia-like disorder    Elevated blood pressure reading without diagnosis of hypertension    Fibromyalgia affecting multiple sites    Hashimoto's thyroiditis    Pre-syncope    Orthostatic hypotension    Nexplanon in place    Renal insufficiency syndrome    Stage 3a chronic kidney disease (CKD) (HCC)    Syncope    Abnormal TSH    Graves disease    Diabetic nephropathy associated with type 1 diabetes mellitus (HCC)    Secondary oligomenorrhea    Hypercholesteremia    Bloating    Diabetic 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)      Family History   Problem Relation Age of Onset    Atrial Fibrillation Mother     Other Father       Current Outpatient Medications   Medication Sig Dispense Refill    rosuvastatin  (CRESTOR ) 5 MG tablet Take 1 tablet by mouth daily 90 tablet 1    Finerenone  10 MG TABS Take 10 mg by mouth daily  30 tablet 2    Insulin  Aspart, w/Niacinamide, (FIASP ) 100 UNIT/ML SOLN INJECT UP TO A TOTAL DAILY DOSE OF 65 UNITS DAILY AS DIRECTED VIA INSULIN  PUMP 60 mL 1    propylthiouracil  (PTU) 50 MG tablet TAKE 1 TABLET BY MOUTH EVERY DAY 90 tablet 1    Insulin  Disposable Pump (OMNIPOD 5 DEXG7G6 PODS GEN 5) MISC CHANGE EVERY 3 DAYS 30 each 3    Continuous Glucose Transmitter (DEXCOM G6 TRANSMITTER) MISC DEVICE FOR CONTINUOUS GLUCOSE MONITORING CHANGE EVERY 3 MONTHS 1 each 3    insulin  lispro (HUMALOG ,ADMELOG ) 100 UNIT/ML SOLN injection vial Use up to 65 units qd via insulin  pump 60 mL 1    Continuous Glucose Sensor (DEXCOM G6 SENSOR) MISC Change every 10 days 9 each 3    lisinopril (PRINIVIL;ZESTRIL) 5 MG tablet Take 1 tablet by mouth daily      Insulin  Disposable Pump (OMNIPOD 5 G6 INTRO, GEN 5,) KIT Use as directed 1 kit 0    Insulin  Glargine-yfgn (SEMGLEE , YFGN,) 100 UNIT/ML SOPN INJECT UP TO 22 UNITS EVERY DAY 45 mL 1    Insulin  Pen Needle 31G X 5 MM MISC Use up to 8 per day to inject insulin  800 each 3    blood glucose test strips (ACCU-CHEK GUIDE) strip Use to test blood glucose levels up to five times daily. 200 each 3    blood glucose test strips (ACCU-CHEK GUIDE) strip Use 5-6 times daily to check blood sugars 600 each 3     No current facility-administered medications for this visit.        I reviewed the following labs with Kathryn [name]    Hemoglobin A1C   Date Value Ref Range Status   04/06/2023 5.8 4.0 - 6.0 % Final     Comment:     Reference Range  Diabetic >=6.5%  Prediabetes  5.7-6.4%  Normal       <5.7%  The American Diabetes Association recommends an A1C goal of <7% for most adults with diabetes.       Hemoglobin A1C, POC   Date Value Ref Range Status   03/04/2024 6.6 % Final     Lab Results   Component  Value Date    CHOL 177 03/01/2024    TRIG 49 03/01/2024    HDL 68 03/01/2024    LDL 99.2 03/01/2024    CHOLHDLRATIO 2.6 03/01/2024        Lab Results   Component Value Date    NA 138 03/01/2024    K 4.7  03/01/2024    CL 105 03/01/2024    CO2 23 03/01/2024    BUN 44 (H) 03/01/2024    CREATININE 1.74 (H) 03/01/2024    GLUCOSE 122 (H) 03/01/2024    CALCIUM  8.7 03/01/2024    BILITOT 0.25 03/01/2024    ALKPHOS 60 03/01/2024    AST 20 03/01/2024    ALT 14 03/01/2024    LABGLOM 39 (L) 03/01/2024    GFRAA >60 03/27/2019    AGRATIO 0.8 (L) 03/26/2019     Lab Results   Component Value Date    ALBCREAT 210 (H) 12/13/2022         No results found for: VITD25      BP 118/64 (BP Site: Right Upper Arm, Patient Position: Sitting, BP Cuff Size: Medium Adult)   Pulse 80   General:  Well appearing in no acute distress  HEENT:  Head is atraumatic normocephalic neck is supple there is no cervical lymphadenopathy no palpable goiter or nodules  Lungs:  Clear to auscultation  Cardiac:  Regular rate and rhythm normal S1-S2 without murmur or gallop  Dermatologic exam without suspicious lesions  Neurological exam:  Affect is bright there is no tremor deep tender reflexes are 2+ in relaxation phase the gait is normal   Diabetic foot exam:   Left Foot:   Visual Exam: normal   Pulse DP: 2+ (normal)   Filament test: normal sensation   Vibratory Sensation: normal  Right Foot:   Visual Exam: normal   Pulse DP: 2+ (normal)   Filament test: normal sensation   Vibratory Sensation: normal                  1. 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 Glycemic control is good. Hga1c = 6.6%.Average blood sugars is 152 . Time in target range is 79.%(goal > 70%). Hypoglycemia is infrequent with 1 % of blood sugars < 70(level 1 hypoglycemia) and 0 % < 54(level 2 hypoglycemia).  Glucose variability/ coefficient of variation is normal range at 25.5%(goal < 36%) . Total basal insulin  via automated mode= 13.3 units/ 24 hours. Total basal insulin  manual mode = 13.2 units/ 24 hours. Patterns:blood sugars are stable while fasting indicating that basal insulin  as  determined by omni pod 5 adaptive basal rates is appropriate. postprandial hyperglycemia suggestive of an insufficient I:CHO     PLAN:  - basal rate is managed by omnipod smart adjust technology and appears appropriate  - Insulin  to carbohydrate ratio appears to be insufficient  - Adjust active insulin  duration from 3 to 2 hours  - Change insulin -to-carb ratio to 1:9.5  - decrease correct above threshold to 110  - reviewed rotation of pod placement  -treatment and prevention of hypoglycemia reviewed  -reviewed need to have back up insulin  syringe in case of Pod failure  -  Orders:  -     AMB POC HEMOGLOBIN A1C  -     Lipoprotein A (LPA); Future  -     rosuvastatin  (CRESTOR ) 5 MG tablet; Take 1 tablet by mouth daily, Disp-90  tablet, R-1Normal  -     Finerenone  10 MG TABS; Take 10 mg by mouth daily, Disp-30 tablet, R-2Normal  -     Basic Metabolic Panel; Future  -     External Referral To Neurology - Eval and Treat, Neurology  -     GLUCOSE MONITOR, 72 HOUR, PHYS INTERP  2. Bloating  Assessment & Plan:   Will screen for pancreatic exocrine insufficiency  Orders:  -     Pancreatic elastase, fecal; Future  3. Hypercholesteremia  Assessment & Plan:  Last lipid profile  revealed :LDL above target range Triglycerides in target range.   PLAN:  - rosuvastatin  5 mg po qd  - goal LDL < 40  Lab Results   Component Value Date    CHOL 177 03/01/2024    TRIG 49 03/01/2024    HDL 68 03/01/2024    LDL 99.2 03/01/2024    CHOLHDLRATIO 2.6 03/01/2024       4. Diabetic nephropathy associated with type 1 diabetes mellitus (HCC)  Assessment & Plan:   Last micro albumin  creat ratio was  elevated  PLAN:  continue ACE  blood pressure in target range  no SGLP2 due to DKA risks  add finerenone . Monitor potasiium  Has upcoming nephrology consult  5. Graves disease  Assessment & Plan:  Good adherence. Clinically euthyroid. TFT's in target range  PLAN:  - continue PTU 50 mg qd  - agranulocytosis risks and precautions reviewed  -  6. 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 Will refer to [facility] neurology  7. Secondary oligomenorrhea  Assessment & Plan:   NL prolactin. Estradiol lower than needed to preserve BMD. Suspect amenorrhea is related to nexiplon use. Advised follow up with gyn     Level of Service based on time: I personally spent a total of 40 minutes on  7/14/2025doing chart preparation,reviewing previous records and labs,performing medically necessary evaluation, counseling and educating the patient as well as documenting the clinical information in the electronic medical records. This does not include time devoted to CGMS interpretation. CGMS interpretation is billed as a separate identifiable service       Orders Placed This Encounter    GLUCOSE MONITOR, 72 HOUR, PHYS INTERP    Lipoprotein A (LPA)     Standing Status:   Future     Expected Date:   03/04/2024     Expiration Date:   03/04/2025    Basic Metabolic Panel     Standing Status:   Future     Expected Date:   03/04/2024     Expiration Date:   03/04/2025    Pancreatic elastase, fecal     Standing Status:   Future     Expected Date:   03/04/2024     Expiration Date:   03/04/2025    External Referral To Neurology - Eval and Treat, Neurology     Referral Priority:   Routine     Referral Type:   Eval and Treat     Referral Reason:   Specialty Services Required     Requested Specialty:   Neurology     Number of Visits Requested:   1    AMB POC HEMOGLOBIN A1C    rosuvastatin  (CRESTOR ) 5 MG tablet     Sig: Take 1 tablet by mouth daily     Dispense:  90 tablet  Refill:  1    Finerenone  10 MG TABS     Sig: Take 10 mg by mouth daily     Dispense:  30 tablet     Refill:  2      The patient (or guardian, if applicable) and other individuals in attendance with the patient were advised that Artificial Intelligence will be utilized during this visit to  record, process the conversation to generate a clinical note, and support improvement of the AI technology. The patient (or guardian, if applicable) and other individuals in attendance at the appointment consented to the use of AI, including the recording.

## 2024-03-04 NOTE — Assessment & Plan Note (Signed)
 Last micro albumin  creat ratio was  elevated  PLAN:  continue ACE  blood pressure in target range  no SGLP2 due to DKA risks  add finerenone . Monitor potasiium  Has upcoming nephrology consult

## 2024-03-04 NOTE — Assessment & Plan Note (Signed)
 Last lipid profile  revealed :LDL above target range Triglycerides in target range.   PLAN:  - rosuvastatin  5 mg po qd  - goal LDL < 40  Lab Results   Component Value Date    CHOL 177 03/01/2024    TRIG 49 03/01/2024    HDL 68 03/01/2024    LDL 99.2 03/01/2024    CHOLHDLRATIO 2.6 03/01/2024

## 2024-03-04 NOTE — Assessment & Plan Note (Signed)
 NL prolactin. Estradiol lower than needed to preserve BMD. Suspect amenorrhea is related to nexiplon use. Advised follow up with gyn

## 2024-03-04 NOTE — Assessment & Plan Note (Signed)
 Good adherence. Clinically euthyroid. TFT's in target range  PLAN:  - continue PTU 50 mg qd  - agranulocytosis risks and precautions reviewed  -

## 2024-03-04 NOTE — Assessment & Plan Note (Signed)
 Notes hyperhidrosis despite return to euthyroid state.Suspect secondary to autonomic neuropathy. Will refer to [facility] neurology

## 2024-03-07 NOTE — Telephone Encounter (Signed)
 [facility] APPROVAL for [place]   03/06/24 - 03/06/25

## 2024-03-15 LAB — TESTOSTERONE, FREE, TOTAL
Testosterone, Free: 0.76 ng/dL (ref 0.13–1.03)
Testosterone: 23 ng/dL (ref 8–60)

## 2024-06-02 ENCOUNTER — Encounter

## 2024-06-04 NOTE — Telephone Encounter (Signed)
"  Medications Requested:  Requested Prescriptions     Refused Prescriptions Disp Refills    rosuvastatin  (CRESTOR ) 5 MG tablet [Pharmacy Med Name: ROSUVASTATIN  CALCIUM  5 MG TAB] 90 tablet 1     Sig: TAKE 1 TABLET BY MOUTH EVERY DAY       Preferred Pharmacy:   [facility] [address], [place] [address] [place] [phone] [place] [phone]  [address]  [place] [address]  Phone: [phone] Fax: [phone]      Date of Last Refill:  03/04/24     Patient/ Pharmacy has requested refill too soon.    Disp Refills Start End    rosuvastatin  (CRESTOR ) 5 MG tablet 90 tablet 1 03/04/2024 --    Sig - Route: Take 1 tablet by mouth daily - Oral    Sent to pharmacy as: Rosuvastatin  Calcium  5 MG Oral Tablet (CRESTOR )    E-Prescribing Status: Receipt confirmed by pharmacy (03/04/2024 12:27 PM EDT)      "

## 2024-06-16 ENCOUNTER — Encounter

## 2024-06-19 MED ORDER — DEXCOM G6 SENSOR MISC
3 refills | 52.50000 days | Status: AC
Start: 2024-06-19 — End: ?

## 2024-06-19 NOTE — Telephone Encounter (Signed)
"  Medications Requested:  Requested Prescriptions     Pending Prescriptions Disp Refills    Continuous Glucose Sensor (DEXCOM G6 SENSOR) MISC [Pharmacy Med Name: DEXCOM G6 SENSOR] 9 each 3     Sig: CHANGE EVERY 10 DAYS   Pharmacy requested:  Dispense: 9 Device, Refill: 3    Preferred Pharmacy:   [facility] [address], [place] [address] [place] [phone] [place] [phone]  [address]  [place] [address]  Phone: [phone] Fax: [phone]    Date of Last Refill: 03/20/23   Dispense Quantity: 9 each Refills: 3       Last appt @ PCP Office: 03/04/2024     Future Appointments   Date Time Provider Department Center   07/24/2024  8:00 AM Darrel Rollene BROCKS, MD [facility]       MOST RECENT BLOOD PRESSURES  BP Readings from Last 3 Encounters:   03/04/24 118/64   07/06/23 (!) 140/82   04/07/23 112/70         MOST RECENT LAB DATA  Lab Results   Component Value Date/Time    K 4.7 03/01/2024 10:56 AM    ALT 14 03/01/2024 10:56 AM    TSH 0.394 03/01/2024 10:56 AM    CHOL 177 03/01/2024 10:56 AM    HGB 9.9 04/06/2023 07:18 AM    HCT 29.3 04/06/2023 07:18 AM    HBA1CPOC 6.6 03/04/2024 11:54 AM     "

## 2024-07-24 ENCOUNTER — Ambulatory Visit: Payer: PRIVATE HEALTH INSURANCE | Attending: Internal Medicine | Primary: Internal Medicine

## 2024-08-29 ENCOUNTER — Encounter

## 2024-08-29 MED ORDER — FIASP 100 UNIT/ML IJ SOLN
100 | INTRAMUSCULAR | 1 refills | 71.00000 days | Status: AC
Start: 2024-08-29 — End: ?

## 2024-08-29 MED ORDER — ROSUVASTATIN CALCIUM 5 MG PO TABS
5 | ORAL_TABLET | Freq: Every day | ORAL | 1 refills | 90.00000 days | Status: AC
Start: 2024-08-29 — End: ?

## 2024-08-29 NOTE — Telephone Encounter (Signed)
 Medications Requested:  Requested Prescriptions   Signed Prescriptions Disp Refills    FIASP  100 UNIT/ML SOLN 60 mL 1     Sig: INJECT UP TO A TOTAL DAILY DOSE OF 65 UNITS DAILY AS DIRECTED VIA INSULIN  PUMP       Insulin  Protocol Passed - 08/29/2024 12:52 PM        Passed - Last creatinine level resulted within the past 12 months     Creatinine   Date Value Ref Range Status   03/01/2024 1.74 (H) 0.51 - 0.95 MG/DL Final   91/84/7975 8.27 (H) 0.51 - 0.95 MG/DL Final   95/76/7975 8.32 (H) 0.51 - 0.95 MG/DL Final             Passed - Last HgbA1c resulted within the past 6 months     Hemoglobin A1C   Date Value Ref Range Status   04/06/2023 5.8 4.0 - 6.0 % Final     Comment:     Reference Range  Diabetic >=6.5%  Prediabetes  5.7-6.4%  Normal       <5.7%  The American Diabetes Association recommends an A1C goal of <7% for most adults with diabetes.     12/13/2022 6.2 (H) 4.0 - 6.0 % Final     Comment:     Reference Range  Diabetic >=6.5%  Prediabetes  5.7-6.4%  Normal       <5.7%  The American Diabetes Association recommends an A1C goal of <7% for most adults with diabetes.     02/02/2022 5.6 4.0 - 6.0 % Final     Comment:     Reference Range  Diabetic >=6.5%  Prediabetes  5.7-6.4%  Normal       <5.7%  The American Diabetes Association recommends an A1C goal of <7% for most adults with diabetes.       Hemoglobin A1C, POC   Date Value Ref Range Status   03/04/2024 6.6 % Final   07/06/2023 6.0 % Final             Passed - Visit with authorizing provider in past 6 months or upcoming 90 days          rosuvastatin  (CRESTOR ) 5 MG tablet 90 tablet 1     Sig: TAKE 1 TABLET BY MOUTH EVERY DAY       Hmg CoA Reductase Inhibitors Protocol Passed - 08/29/2024 12:52 PM        Passed - Last Lipid panel resulted within the past 12 months     Lab Results   Component Value Date    CHOL 177 03/01/2024    LDL 99.2 03/01/2024                Passed - Visit with authorizing provider in past 9 months or upcoming 90 days        Passed - Last AST and  ALT levels < or = to 3x normal within the past 3 years     AST   Date Value Ref Range Status   03/01/2024 20 0 - 35 U/L Final   04/06/2023 29 0 - 35 U/L Final   12/13/2022 33 0 - 35 U/L Final       ALT   Date Value Ref Range Status   03/01/2024 14 0 - 35 U/L Final   04/06/2023 25 0 - 35 U/L Final   12/13/2022 29 0 - 35 U/L Final             Passed -  No positive pregnancy test or active pregnancy on record in past 12 months             Preferred Pharmacy:   [facility] [address] [place] [address] [place] [phone] [place] [phone]  [address]  [place] [address]  Phone: [phone] Fax: [phone]      Prescription Refill Protocol reviewed: Yes    Allergy List Reviewed and Verified: Yes    Possible medication to medication interactions reviewed: Yes    Last appt @ Office: 03/04/2024     No future appointments.    BP Readings from Last 3 Encounters:   03/04/24 118/64   07/06/23 (!) 140/82   04/07/23 112/70

## 2024-08-29 NOTE — Telephone Encounter (Signed)
 Patient's name and date of birth verified at start of call.   Incoming refill request.  Caller has been notified that we require a minimum of 2 business days for refill processing. (This does not include weekends, holidays, etc.)      Kathryn [name]  [birth year]      Confirmed best contact number:   Home Phone [phone] (home)    Medications Requested:  Requested Prescriptions     Pending Prescriptions Disp Refills    Finerenone  10 MG TABS 30 tablet 2     Sig: Take 10 mg by mouth daily    propylthiouracil  (PTU) 50 MG tablet 90 tablet 1     Sig: Take 1 tablet by mouth daily       Preferred Pharmacy:   [facility] [address] [place] [address] [place] [phone] [place] [phone]  [address]  [place] [address]  Phone: [phone] Fax: [phone]    Has patient already contacted pharmacy to confirm no refills were on file: Yes    Notes for office regarding medication request:PATIENT IS OUT OF MEDICATION          Other instructions and notes:  Last visit with provider: 03/04/2024  Next scheduled visit with provider: 04/23/2025  *If next visit is not scheduled, book next needed visit or document if recall was added to list prior to sending for processing*  Inform patient that this needs to be on file before sending request as we do require for them to remain up-to-date with their recommended healthcare in order to avoid any interruptions in our ability to provide ongoing care such as refills.     Patient MyChart Status:  For Active Patients - Patient has been notified that they will receive an automated notification via MyChart once their script has been processed.   For Inactive Patients - Patient is aware that we have a patient portal, MyChart, which offers many benefits such as being able to request their refills electronically and receive automated notifications when scripts are processed.  In addition, they can schedule and manage appointments, view their testing results and visit notes, and stay connected  with their care team.  Patient offered MyChart today.  Patient accepted.  (Send link for set up if accepted)

## 2024-08-30 NOTE — Telephone Encounter (Signed)
 No protocol to follow.  Pt cancelled December 2025 visit,  next appointment is scheduled for 04/23/25    Medications Requested:  Requested Prescriptions     Pending Prescriptions Disp Refills    Finerenone  10 MG TABS 30 tablet 2     Sig: Take 10 mg by mouth daily    propylthiouracil  (PTU) 50 MG tablet 90 tablet 1     Sig: Take 1 tablet by mouth daily       Preferred Pharmacy:   [facility] [address], [place] [address] [place] [phone] [place] [phone]  [address]  [place] [address]  Phone: [phone] Fax: [phone]          Prescription Refill Protocol reviewed: Yes    Allergy List Reviewed and Verified: Yes    Possible medication to medication interactions reviewed: Yes    Last appt @  Office: 03/04/2024     Future Appointments   Date Time Provider Department Center   04/23/2025  1:30 PM Darrel Rollene BROCKS, MD [facility]       MOST RECENT BLOOD PRESSURES  BP Readings from Last 3 Encounters:   03/04/24 118/64   07/06/23 (!) 140/82   04/07/23 112/70         MOST RECENT LAB DATA  Lab Results   Component Value Date/Time    K 4.7 03/01/2024 10:56 AM    ALT 14 03/01/2024 10:56 AM    TSH 0.394 03/01/2024 10:56 AM    CHOL 177 03/01/2024 10:56 AM    HGB 9.9 04/06/2023 07:18 AM    HCT 29.3 04/06/2023 07:18 AM    HBA1CPOC 6.6 03/04/2024 11:54 AM

## 2024-09-02 MED ORDER — FINERENONE 10 MG PO TABS
10 | ORAL_TABLET | Freq: Every day | ORAL | 0 refills | Status: AC
Start: 2024-09-02 — End: ?

## 2024-09-02 MED ORDER — PROPYLTHIOURACIL 50 MG PO TABS
50 | ORAL_TABLET | Freq: Every day | ORAL | 0 refills | Status: AC
Start: 2024-09-02 — End: ?

## 2024-09-02 NOTE — Telephone Encounter (Signed)
 Chart reviewed  Need to monitor potasium levels with finereone therapy and TFT's for PTU  Limited rx sent to pharmacy  Please remind Rollene Pepper that she is due for labs  Limited rx sent to pharmacy    Orders Placed This Encounter    CBC    Comprehensive Metabolic Panel    Hemoglobin A1C    Albumin/Creatinine Ratio, Urine    TSH    T4, Free    Pancreatic elastase, fecal    Lipid Panel W/ Reflex Direct LDL     Includes::   Reflex to Direct LDL    Lipoprotein A (LPA)    T3, Free     Standing Status:   Future     Expected Date:   09/02/2024     Expiration Date:   09/02/2025    Finerenone  10 MG TABS     Sig: Take 10 mg by mouth daily     Dispense:  30 tablet     Refill:  0    propylthiouracil  (PTU) 50 MG tablet     Sig: Take 1 tablet by mouth daily     Dispense:  90 tablet     Refill:  0

## 2024-09-06 NOTE — Telephone Encounter (Signed)
 Mychart message sent to patient.   Lab orders mailed to patient

## 2024-09-07 ENCOUNTER — Encounter

## 2024-09-10 MED ORDER — OMNIPOD 5 DEXG7G6 PODS GEN 5 MISC
3 refills | 30.00000 days | Status: AC
Start: 2024-09-10 — End: ?

## 2024-09-10 NOTE — Telephone Encounter (Signed)
 Medications Requested:  Requested Prescriptions   Signed Prescriptions Disp Refills    Insulin  Disposable Pump (OMNIPOD 5 DEXG7G6 PODS GEN 5) MISC 30 each 3     Sig: CHANGE EVERY 3 DAYS       There is no refill protocol information for this order          Preferred Pharmacy:   [id number] [place], [place] [address] [place] [phone] [place] [phone]  [address]  [place] [address]  Phone: [phone] Fax: [phone]      Prescription Refill Protocol reviewed: N/A    Hemoglobin A1C, POC   Date Value Ref Range Status   03/04/2024 6.6 % Final       Allergy List Reviewed and Verified: Yes    Possible medication to medication interactions reviewed: Yes    Last appt @ Office: 03/04/2024     Future Appointments   Date Time Provider Department Center   04/23/2025  1:30 PM Darrel Rollene BROCKS, MD [facility]       BP Readings from Last 3 Encounters:   03/04/24 118/64   07/06/23 (!) 140/82   04/07/23 112/70

## 2024-09-14 ENCOUNTER — Encounter

## 2024-09-16 MED ORDER — DEXCOM G6 TRANSMITTER MISC
3 refills | Status: AC
Start: 2024-09-16 — End: ?

## 2024-09-16 NOTE — Telephone Encounter (Signed)
 Please see if Kathryn [name] can be seen sooner than 04/2025  OK to book on a Tuesday if needed  Due for labs  Orders Placed This Encounter    Albumin/Creatinine Ratio, Urine    CBC     Standing Status:   Future     Expected Date:   09/16/2024     Expiration Date:   09/16/2025    Comprehensive Metabolic Panel     Standing Status:   Future     Expected Date:   09/16/2024     Expiration Date:   09/16/2025    Hemoglobin A1C     Standing Status:   Future     Expected Date:   09/16/2024     Expiration Date:   09/16/2025    Lipid Panel W/ Reflex Direct LDL     Standing Status:   Future     Expected Date:   09/16/2024     Expiration Date:   09/16/2025     Includes::   Reflex to Direct LDL     Is Patient Fasting?/# of Hours:   10    T4, Free     Standing Status:   Future     Expected Date:   09/16/2024     Expiration Date:   09/16/2025    T3, Free     Standing Status:   Future     Expected Date:   09/16/2024     Expiration Date:   09/16/2025    Lipoprotein A (LPA)     Standing Status:   Future     Expected Date:   09/16/2024     Expiration Date:   09/16/2025    Pancreatic elastase, fecal     Standing Status:   Future     Expected Date:   09/16/2024     Expiration Date:   09/16/2025    Continuous Glucose Transmitter (DEXCOM G6 TRANSMITTER) MISC     Sig: DEVICE FOR CONTINUOUS GLUCOSE MONITORING CHANGE EVERY 3 MONTHS     Dispense:  10 each     Refill:  3

## 2024-09-16 NOTE — Telephone Encounter (Signed)
 Last A1C was 02/2024.     New orders not released.     Pt was to be scheduled and cancelled.    And pt not scheduled until 04/2025.       Will forward to provider to please advise on dispensing quantity, when labs should be done and if Pt should be scheduled sooner.  Thank you  MONALISA COATS, CMA      Medications Requested:  Requested Prescriptions     Pending Prescriptions Disp Refills    Continuous Glucose Transmitter (DEXCOM G6 TRANSMITTER) MISC [Pharmacy Med Name: DEXCOM G6 TRANSMITTER]  3     Sig: DEVICE FOR CONTINUOUS GLUCOSE MONITORING CHANGE EVERY 3 MONTHS       Preferred Pharmacy:   [facility] [address], [place] [address] [place] [phone] [place] [phone]  [address]  [place] [address]  Phone: [phone] Fax: [phone]      Date of Last Refill:    Name from pharmacy: [facility] G6 TRANSMITTER         Will file in chart as: Continuous Glucose Transmitter (DEXCOM G6 TRANSMITTER) MISC    Sig: DEVICE FOR CONTINUOUS GLUCOSE MONITORING CHANGE EVERY 3 MONTHS    Disp: Not specified (Pharmacy requested: 1 Device)    Refills: 3    Start: 09/14/2024    Class: Normal    Non-formulary For: 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)    Last ordered: 1 year ago (08/02/2023) by Rollene JAYSON Greet, MD    Last refill: 06/15/2024    Rx #: [id number]       To be filled at: [id number] - [place], [place] - [address] [place] [phone] - F [phone]          Prescription Refill Protocol reviewed: YES     Allergy List Reviewed and Verified: YES    Possible medication to medication interactions reviewed: YES    Last appt @ Office: 03/04/2024     Future Appointments   Date Time Provider Department Center   04/23/2025  1:30 PM Greet Rollene JAYSON, MD [facility]       MOST RECENT BLOOD PRESSURES  BP Readings from Last 3 Encounters:   03/04/24 118/64   07/06/23 (!) 140/82   04/07/23 112/70         Requested Prescriptions   Pending Prescriptions Disp Refills    Continuous Glucose Transmitter  (DEXCOM G6 TRANSMITTER) MISC [Pharmacy Med Name: DEXCOM G6 TRANSMITTER]  3     Sig: DEVICE FOR CONTINUOUS GLUCOSE MONITORING CHANGE EVERY 3 MONTHS       There is no refill protocol information for this order

## 2024-10-22 ENCOUNTER — Ambulatory Visit: Payer: PRIVATE HEALTH INSURANCE | Attending: Internal Medicine | Primary: Internal Medicine
